# Patient Record
Sex: Female | Born: 1975 | Race: White | Hispanic: No | Marital: Married | Smoking: Former smoker
Health system: Southern US, Community
[De-identification: ages and names within clinical notes are randomized; demographics above are authoritative.]

## PROBLEM LIST (undated history)

## (undated) DIAGNOSIS — K219 Gastro-esophageal reflux disease without esophagitis: Secondary | ICD-10-CM

## (undated) DIAGNOSIS — G4730 Sleep apnea, unspecified: Secondary | ICD-10-CM

## (undated) DIAGNOSIS — J45909 Unspecified asthma, uncomplicated: Secondary | ICD-10-CM

## (undated) DIAGNOSIS — E119 Type 2 diabetes mellitus without complications: Secondary | ICD-10-CM

## (undated) HISTORY — PX: TUBAL LIGATION: SHX77

---

## 2010-05-01 ENCOUNTER — Emergency Department (HOSPITAL_COMMUNITY): Admission: EM | Admit: 2010-05-01 | Discharge: 2010-05-02 | Payer: Self-pay | Admitting: Emergency Medicine

## 2011-03-13 LAB — POCT I-STAT, CHEM 8
BUN: 10 mg/dL (ref 6–23)
Chloride: 102 mEq/L (ref 96–112)
Potassium: 3.5 mEq/L (ref 3.5–5.1)
Sodium: 137 mEq/L (ref 135–145)
TCO2: 26 mmol/L (ref 0–100)

## 2011-03-13 LAB — URINE MICROSCOPIC-ADD ON

## 2011-03-13 LAB — URINALYSIS, ROUTINE W REFLEX MICROSCOPIC
Glucose, UA: NEGATIVE mg/dL
Protein, ur: NEGATIVE mg/dL
Specific Gravity, Urine: >1.03 — ABNORMAL HIGH (ref 1.005–1.030)

## 2011-12-21 ENCOUNTER — Encounter: Payer: Self-pay | Admitting: *Deleted

## 2011-12-21 ENCOUNTER — Emergency Department (HOSPITAL_COMMUNITY)
Admission: EM | Admit: 2011-12-21 | Discharge: 2011-12-22 | Disposition: A | Discharge status: Home / Self Care | Payer: BC Managed Care – PPO | Attending: Emergency Medicine | Admitting: Emergency Medicine

## 2011-12-21 ENCOUNTER — Emergency Department (HOSPITAL_COMMUNITY): Payer: BC Managed Care – PPO

## 2011-12-21 DIAGNOSIS — R109 Unspecified abdominal pain: Secondary | ICD-10-CM | POA: Insufficient documentation

## 2011-12-21 DIAGNOSIS — E860 Dehydration: Secondary | ICD-10-CM

## 2011-12-21 DIAGNOSIS — Z87891 Personal history of nicotine dependence: Secondary | ICD-10-CM | POA: Insufficient documentation

## 2011-12-21 DIAGNOSIS — R110 Nausea: Secondary | ICD-10-CM | POA: Insufficient documentation

## 2011-12-21 LAB — URINALYSIS, ROUTINE W REFLEX MICROSCOPIC
Bilirubin Urine: NEGATIVE
Glucose, UA: NEGATIVE mg/dL
Ketones, ur: NEGATIVE mg/dL
Nitrite: NEGATIVE
pH: 5.5 (ref 5.0–8.0)

## 2011-12-21 MED ORDER — HYDROMORPHONE HCL PF 1 MG/ML IJ SOLN
1.0000 mg | INTRAMUSCULAR | Status: DC | PRN
  Administered 2011-12-22: 1 mg via INTRAVENOUS
  Filled 2011-12-21: qty 1

## 2011-12-21 MED ORDER — KETOROLAC TROMETHAMINE 30 MG/ML IJ SOLN
30.0000 mg | Freq: Four times a day (QID) | INTRAMUSCULAR | Status: DC | PRN
  Administered 2011-12-22: 30 mg via INTRAVENOUS
  Filled 2011-12-21: qty 1

## 2011-12-21 MED ORDER — SODIUM CHLORIDE 0.9 % IV BOLUS (SEPSIS)
1000.0000 mL | Freq: Once | INTRAVENOUS | Status: AC
  Administered 2011-12-22: 1000 mL via INTRAVENOUS

## 2011-12-21 MED ORDER — ONDANSETRON HCL 4 MG/2ML IJ SOLN
4.0000 mg | Freq: Four times a day (QID) | INTRAMUSCULAR | Status: DC | PRN
  Administered 2011-12-22: 4 mg via INTRAVENOUS
  Filled 2011-12-21: qty 2

## 2011-12-21 NOTE — ED Notes (Signed)
Pt reports pain with urination that just started since arrival.  Pt reports her lower abdomen hurts with voiding.  Pt reports sharp pain in her lower back that radiates to front and is throbbing in lower abdomen.  Pt denies frequency and burning with urination.

## 2011-12-21 NOTE — ED Notes (Addendum)
Pt c/o bilateral flank pain that radiates around to front of abd area, pt c/o pain to lower abd area. Denies any uti symptoms, pt states that she had an uti about a month ago, finished antibodics, ? Kidney stones in past, does admit to nausea,

## 2011-12-21 NOTE — ED Provider Notes (Signed)
History   This chart was scribed for Vida Roller, MD by Charolett Bumpers . The patient was seen in room APA07/APA07 and the patient's care was started at 11:12pm.  CSN: [id number]  Arrival date & time 12/21/11  2047   First MD Initiated Contact with Patient 12/21/11 2302      Chief Complaint  Patient presents with  . Back Pain    (Consider location/radiation/quality/duration/timing/severity/associated sxs/prior treatment) HPI Monique Foster is a 35 y.o. female who presents to the Emergency Department complaining of intermittent, sharp, stabbing pain in her right side flank that radiates to her RLQ with an onset of about 10 hours ago. Patient states having associated nausea when pain is present. She also reports having abdominal pain for the past couple of days that has been worsening. The pain is currently tolerable, and is a 8 out of 10 at its worse. Patient reports no changes in BMs and denies dysuria, blood in stool or urine, vomiting, diarrhea, fever, and chills. She reported no h/o kidney stones, and the only abdomen surgery was a C-section.    History reviewed. No pertinent past medical history.  Past Surgical History  Procedure Date  . Cesarean section     History reviewed. No pertinent family history.  History  Substance Use Topics  . Smoking status: Former Games developer  . Smokeless tobacco: Not on file  . Alcohol Use: No    OB History    Grav Para Term Preterm Abortions TAB SAB Ect Mult Living                  Review of Systems A complete 10 system review of systems was obtained and is otherwise negative except as noted in the HPI and PMH.   Allergies  Iodine and Penicillins  Home Medications   Current Outpatient Rx  Name Route Sig Dispense Refill  . NAPROXEN 500 MG PO TABS Oral Take 1 tablet (500 mg total) by mouth 2 (two) times daily with a meal. 30 tablet 0    BP 117/64  Pulse 68  Temp(Src) 98.2 F (36.8 C) (Oral)  Resp 20  Ht 5\' 3"  (1.6 m)   Wt 220 lb (99.791 kg)  BMI 38.97 kg/m2  SpO2 98%  LMP 11/21/2011  Physical Exam  Nursing note and vitals reviewed. Constitutional: She is oriented to person, place, and time. She appears well-developed and well-nourished. No distress.  HENT:  Head: Normocephalic and atraumatic.  Mouth/Throat: Oropharynx is clear and moist. No oropharyngeal exudate.  Eyes: EOM are normal. Pupils are equal, round, and reactive to light.  Neck: Neck supple. No tracheal deviation present.  Cardiovascular: Normal rate, regular rhythm, normal heart sounds and intact distal pulses.   Pulmonary/Chest: Effort normal and breath sounds normal. No respiratory distress.  Abdominal: Soft. She exhibits no distension. There is no tenderness (non-peritoneal).       Right sided CVA tenderness   Musculoskeletal: Normal range of motion. She exhibits no edema.  Neurological: She is alert and oriented to person, place, and time. No sensory deficit.  Skin: Skin is warm and dry.  Psychiatric: She has a normal mood and affect. Her behavior is normal.    ED Course  Procedures (including critical care time)  Labs Reviewed  URINALYSIS, ROUTINE W REFLEX MICROSCOPIC - Abnormal; Notable for the following:    Specific Gravity, Urine >1.030 (*)    All other components within normal limits  BASIC METABOLIC PANEL - Abnormal; Notable for the following:  Glucose, Bld 132 (*)    All other components within normal limits  PREGNANCY, URINE  CBC  I-STAT, CHEM 8   Ct Abdomen Pelvis Wo Contrast  12/22/2011  *RADIOLOGY REPORT*  Clinical Data: Bilateral flank pain, right greater than left, with past 3 days.  Nausea and vomiting.  CT ABDOMEN AND PELVIS WITHOUT CONTRAST 12/21/2009:  Technique:  Multidetector CT imaging of the abdomen and pelvis was performed following the standard protocol without intravenous contrast.  Comparison: None.  Findings: No evidence of urinary tract calculi or obstruction on either side.  Approximate 1.3 cm  cyst arising from the lower pole of the right kidney.  Within the limits of the unenhanced technique, no other focal parenchymal abnormality involving either kidney.  Diffuse hepatic steatosis with focal sparing surrounding the gallbladder.  Riedel lobe of liver.  Within the limits the unenhanced technique, no focal hepatic parenchymal abnormality. Upper normal-sized liver measuring approximately 1.4 x 4.4 x 1.1 cm, without focal splenic parenchymal abnormality.  Gallbladder unremarkable by CT.  No biliary ductal dilation.  Normal unenhanced appearance of the pancreas and adrenal glands.  No visible aorto- iliofemoral atherosclerosis.  No significant lymphadenopathy. Retroaortic left renal vein.  Normal appearing stomach, small bowel, and colon.  Normal appendix in the right upper pelvis, containing high attenuation ingested material.  No ascites.  Uterus and ovaries unremarkable for the unenhanced technique. Urinary bladder unremarkable.  Bone window images demonstrate slight lumbar scoliosis convex right.  Visualized lung bases clear.  IMPRESSION:  1.  No evidence of urinary tract calculi or obstruction on either side. 2.  No acute abnormalities involving the abdomen or pelvis. 3.  Diffuse hepatic steatosis with focal sparing surrounding the gallbladder.  Original Report Authenticated By: Arnell Sieving, M.D.     1. Abdominal pain   2. Dehydration       MDM  Patient with right-sided abdominal pain and right flank pain. There is no significant tenderness on my exam. Suspect kidney stone on initial exam however urinalysis clear with no signs of hematuria.  Basic metabolic panel shows no acidosis and normal renal function, normal electrolytes. Complete blood count shows normal white blood cells and no anemia. Patient was given IV fluids for dehydration, pain medication. She states at this time she has no pain and on reexamination has no tenderness. I have discussed return for worsening or changing  symptoms with patient and family member, they have expressed her understanding.  I personally performed the services described in this documentation, which was scribed in my presence. The recorded information has been reviewed and considered.    Vida Roller, MD 12/22/11 [phone]

## 2011-12-22 LAB — POCT I-STAT, CHEM 8
Creatinine, Ser: 0.6 mg/dL (ref 0.50–1.10)
Glucose, Bld: 127 mg/dL — ABNORMAL HIGH (ref 70–99)
Hemoglobin: 12.2 g/dL (ref 12.0–15.0)
TCO2: 25 mmol/L (ref 0–100)

## 2011-12-22 LAB — CBC
Hemoglobin: 12.7 g/dL (ref 12.0–15.0)
MCH: 28.9 pg (ref 26.0–34.0)
MCV: 85 fL (ref 78.0–100.0)
RBC: 4.4 MIL/uL (ref 3.87–5.11)

## 2011-12-22 LAB — BASIC METABOLIC PANEL
CO2: 29 mEq/L (ref 19–32)
Calcium: 9.6 mg/dL (ref 8.4–10.5)
Creatinine, Ser: 0.6 mg/dL (ref 0.50–1.10)
Glucose, Bld: 132 mg/dL — ABNORMAL HIGH (ref 70–99)

## 2011-12-22 MED ORDER — NAPROXEN 500 MG PO TABS: 500.0000 mg | ORAL_TABLET | Freq: Two times a day (BID) | ORAL | Status: AC

## 2011-12-22 NOTE — ED Notes (Signed)
Patient is alert and oriented x 4 with respirations even and unlabored.  NAD at this time.  Discharge instructions reviewed with patient and patient verbalized understanding.  Pt transported to POV via wheelchair for pt safety, and husband to transport pt home.

## 2012-04-14 ENCOUNTER — Encounter (HOSPITAL_COMMUNITY): Payer: Self-pay | Admitting: *Deleted

## 2012-04-14 DIAGNOSIS — W57XXXA Bitten or stung by nonvenomous insect and other nonvenomous arthropods, initial encounter: Secondary | ICD-10-CM | POA: Insufficient documentation

## 2012-04-14 DIAGNOSIS — S90569A Insect bite (nonvenomous), unspecified ankle, initial encounter: Secondary | ICD-10-CM | POA: Insufficient documentation

## 2012-04-14 DIAGNOSIS — R1110 Vomiting, unspecified: Secondary | ICD-10-CM | POA: Insufficient documentation

## 2012-04-14 NOTE — ED Notes (Signed)
NVD all day,  Tick removed from rt knee today.

## 2012-04-15 ENCOUNTER — Emergency Department (HOSPITAL_COMMUNITY)
Admission: EM | Admit: 2012-04-15 | Discharge: 2012-04-15 | Disposition: A | Discharge status: Home / Self Care | Payer: BC Managed Care – PPO | Attending: Emergency Medicine | Admitting: Emergency Medicine

## 2012-04-15 DIAGNOSIS — W57XXXA Bitten or stung by nonvenomous insect and other nonvenomous arthropods, initial encounter: Secondary | ICD-10-CM

## 2012-04-15 DIAGNOSIS — R1110 Vomiting, unspecified: Secondary | ICD-10-CM

## 2012-04-15 LAB — DIFFERENTIAL
Basophils Relative: 0 % (ref 0–1)
Eosinophils Absolute: 0.1 10*3/uL (ref 0.0–0.7)
Eosinophils Relative: 1 % (ref 0–5)
Monocytes Relative: 9 % (ref 3–12)
Neutrophils Relative %: 74 % (ref 43–77)

## 2012-04-15 LAB — COMPREHENSIVE METABOLIC PANEL
Albumin: 3.5 g/dL (ref 3.5–5.2)
Alkaline Phosphatase: 56 U/L (ref 39–117)
BUN: 14 mg/dL (ref 6–23)
Calcium: 9.1 mg/dL (ref 8.4–10.5)
Potassium: 3.7 mEq/L (ref 3.5–5.1)
Total Protein: 7.3 g/dL (ref 6.0–8.3)

## 2012-04-15 LAB — CBC
HCT: 37.6 % (ref 36.0–46.0)
Hemoglobin: 12.7 g/dL (ref 12.0–15.0)
MCH: 28.5 pg (ref 26.0–34.0)
MCHC: 33.8 g/dL (ref 30.0–36.0)
MCV: 84.3 fL (ref 78.0–100.0)
Platelets: 173 10*3/uL (ref 150–400)
RBC: 4.46 MIL/uL (ref 3.87–5.11)
RDW: 12.2 % (ref 11.5–15.5)
WBC: 5.1 10*3/uL (ref 4.0–10.5)

## 2012-04-15 MED ORDER — ONDANSETRON HCL 4 MG/2ML IJ SOLN
4.0000 mg | Freq: Once | INTRAMUSCULAR | Status: AC
  Administered 2012-04-15: 4 mg via INTRAVENOUS
  Filled 2012-04-15: qty 2

## 2012-04-15 MED ORDER — HYDROMORPHONE HCL PF 1 MG/ML IJ SOLN
1.0000 mg | Freq: Once | INTRAMUSCULAR | Status: AC
  Administered 2012-04-15: 1 mg via INTRAVENOUS
  Filled 2012-04-15: qty 1

## 2012-04-15 MED ORDER — PROMETHAZINE HCL 25 MG PO TABS: 25.0000 mg | ORAL_TABLET | Freq: Four times a day (QID) | ORAL | Status: DC | PRN

## 2012-04-15 MED ORDER — HYDROCODONE-ACETAMINOPHEN 5-325 MG PO TABS: 1.0000 | ORAL_TABLET | Freq: Four times a day (QID) | ORAL | Status: AC | PRN

## 2012-04-15 MED ORDER — DOXYCYCLINE HYCLATE 100 MG PO CAPS: 100.0000 mg | ORAL_CAPSULE | Freq: Two times a day (BID) | ORAL | Status: AC

## 2012-04-15 NOTE — Discharge Instructions (Signed)
Follow up with your md this week. °

## 2012-04-15 NOTE — ED Provider Notes (Signed)
History     CSN: [id number]  Arrival date & time 04/14/12  2227   First MD Initiated Contact with Patient 04/15/12 0232      Chief Complaint  Patient presents with  . Emesis    (Consider location/radiation/quality/duration/timing/severity/associated sxs/prior treatment) Patient is a 36 y.o. female presenting with vomiting. The history is provided by the patient (pt with vomiting and tick bite). No language interpreter was used.  Emesis  This is a new problem. The current episode started 12 to 24 hours ago. The problem occurs 5 to 10 times per day. The problem has not changed since onset.The emesis has an appearance of stomach contents. There has been no fever. Pertinent negatives include no abdominal pain, no chills, no cough, no diarrhea and no headaches. Risk factors: none.    History reviewed. No pertinent past medical history.  Past Surgical History  Procedure Date  . Cesarean section     History reviewed. No pertinent family history.  History  Substance Use Topics  . Smoking status: Former Games developer  . Smokeless tobacco: Not on file  . Alcohol Use: No    OB History    Grav Para Term Preterm Abortions TAB SAB Ect Mult Living                  Review of Systems  Constitutional: Negative for chills and fatigue.  HENT: Negative for congestion, sinus pressure and ear discharge.   Eyes: Negative for discharge.  Respiratory: Negative for cough.   Cardiovascular: Negative for chest pain.  Gastrointestinal: Positive for vomiting. Negative for abdominal pain and diarrhea.  Genitourinary: Negative for frequency and hematuria.  Musculoskeletal: Negative for back pain.  Skin: Negative for rash.  Neurological: Negative for seizures and headaches.  Hematological: Negative.   Psychiatric/Behavioral: Negative for hallucinations.    Allergies  Iodine and Penicillins  Home Medications   Current Outpatient Rx  Name Route Sig Dispense Refill  . DOXYCYCLINE HYCLATE 100 MG  PO CAPS Oral Take 1 capsule (100 mg total) by mouth 2 (two) times daily. 20 capsule 0  . HYDROCODONE-ACETAMINOPHEN 5-325 MG PO TABS Oral Take 1 tablet by mouth every 6 (six) hours as needed for pain. 10 tablet 0  . NAPROXEN 500 MG PO TABS Oral Take 1 tablet (500 mg total) by mouth 2 (two) times daily with a meal. 30 tablet 0  . PROMETHAZINE HCL 25 MG PO TABS Oral Take 1 tablet (25 mg total) by mouth every 6 (six) hours as needed for nausea. 15 tablet 0    BP 110/63  Pulse 106  Temp(Src) 98.6 F (37 C) (Oral)  Resp 20  Ht 5\' 3"  (1.6 m)  Wt 215 lb (97.523 kg)  BMI 38.09 kg/m2  SpO2 99%  LMP 04/10/2012  Physical Exam  Constitutional: She is oriented to person, place, and time. She appears well-developed.  HENT:  Head: Normocephalic and atraumatic.  Eyes: Conjunctivae and EOM are normal. No scleral icterus.  Neck: Neck supple. No thyromegaly present.  Cardiovascular: Normal rate and regular rhythm.  Exam reveals no gallop and no friction rub.   No murmur heard. Pulmonary/Chest: No stridor. She has no wheezes. She has no rales. She exhibits no tenderness.  Abdominal: She exhibits no distension. There is no tenderness. There is no rebound.  Musculoskeletal: Normal range of motion. She exhibits no edema.  Lymphadenopathy:    She has no cervical adenopathy.  Neurological: She is oriented to person, place, and time. Coordination normal.  Skin: No rash  noted. No erythema.  Psychiatric: She has a normal mood and affect. Her behavior is normal.    ED Course  Procedures (including critical care time)  Labs Reviewed  COMPREHENSIVE METABOLIC PANEL - Abnormal; Notable for the following:    Glucose, Bld 141 (*)    All other components within normal limits  CBC  DIFFERENTIAL   No results found.   1. Vomiting   2. Tick bite       MDM  Viral syndrome. Will cover for rmsf since recent tick bite        Benny Lennert, MD 04/15/12 [phone]

## 2014-03-31 DIAGNOSIS — E139 Other specified diabetes mellitus without complications: Secondary | ICD-10-CM | POA: Insufficient documentation

## 2014-05-22 ENCOUNTER — Encounter (HOSPITAL_COMMUNITY): Payer: Self-pay | Admitting: Emergency Medicine

## 2014-05-22 ENCOUNTER — Emergency Department (HOSPITAL_COMMUNITY)
Admission: EM | Admit: 2014-05-22 | Discharge: 2014-05-22 | Disposition: A | Discharge status: Home / Self Care | Payer: BC Managed Care – PPO | Attending: Emergency Medicine | Admitting: Emergency Medicine

## 2014-05-22 DIAGNOSIS — T368X5A Adverse effect of other systemic antibiotics, initial encounter: Secondary | ICD-10-CM | POA: Insufficient documentation

## 2014-05-22 DIAGNOSIS — R221 Localized swelling, mass and lump, neck: Secondary | ICD-10-CM | POA: Diagnosis present

## 2014-05-22 DIAGNOSIS — R220 Localized swelling, mass and lump, head: Secondary | ICD-10-CM | POA: Diagnosis not present

## 2014-05-22 DIAGNOSIS — J320 Chronic maxillary sinusitis: Secondary | ICD-10-CM | POA: Diagnosis not present

## 2014-05-22 DIAGNOSIS — Z87891 Personal history of nicotine dependence: Secondary | ICD-10-CM | POA: Diagnosis not present

## 2014-05-22 DIAGNOSIS — Z880 Allergy status to penicillin: Secondary | ICD-10-CM | POA: Insufficient documentation

## 2014-05-22 DIAGNOSIS — IMO0002 Reserved for concepts with insufficient information to code with codable children: Secondary | ICD-10-CM | POA: Insufficient documentation

## 2014-05-22 DIAGNOSIS — J45909 Unspecified asthma, uncomplicated: Secondary | ICD-10-CM | POA: Insufficient documentation

## 2014-05-22 DIAGNOSIS — Z91040 Latex allergy status: Secondary | ICD-10-CM | POA: Diagnosis not present

## 2014-05-22 DIAGNOSIS — Z792 Long term (current) use of antibiotics: Secondary | ICD-10-CM | POA: Diagnosis not present

## 2014-05-22 DIAGNOSIS — K219 Gastro-esophageal reflux disease without esophagitis: Secondary | ICD-10-CM | POA: Diagnosis not present

## 2014-05-22 DIAGNOSIS — T7840XA Allergy, unspecified, initial encounter: Secondary | ICD-10-CM

## 2014-05-22 HISTORY — DX: Unspecified asthma, uncomplicated: J45.909

## 2014-05-22 HISTORY — DX: Gastro-esophageal reflux disease without esophagitis: K21.9

## 2014-05-22 MED ORDER — DIPHENHYDRAMINE HCL 50 MG/ML IJ SOLN
25.0000 mg | Freq: Once | INTRAMUSCULAR | Status: AC
  Administered 2014-05-22: 25 mg via INTRAVENOUS
  Filled 2014-05-22: qty 1

## 2014-05-22 MED ORDER — METHYLPREDNISOLONE SODIUM SUCC 125 MG IJ SOLR
125.0000 mg | Freq: Once | INTRAMUSCULAR | Status: AC
  Administered 2014-05-22: 125 mg via INTRAVENOUS

## 2014-05-22 MED ORDER — SULFAMETHOXAZOLE-TMP DS 800-160 MG PO TABS
1.0000 | ORAL_TABLET | Freq: Once | ORAL | Status: AC
  Administered 2014-05-22: 1 via ORAL
  Filled 2014-05-22: qty 1

## 2014-05-22 MED ORDER — DIPHENHYDRAMINE HCL 50 MG/ML IJ SOLN
INTRAMUSCULAR | Status: AC
  Filled 2014-05-22: qty 1

## 2014-05-22 MED ORDER — FAMOTIDINE IN NACL 20-0.9 MG/50ML-% IV SOLN
20.0000 mg | Freq: Once | INTRAVENOUS | Status: AC
  Administered 2014-05-22: 20 mg via INTRAVENOUS
  Filled 2014-05-22: qty 50

## 2014-05-22 MED ORDER — METHYLPREDNISOLONE SODIUM SUCC 125 MG IJ SOLR
62.5000 mg | Freq: Once | INTRAMUSCULAR | Status: AC
  Administered 2014-05-22: 62.5 mg via INTRAVENOUS
  Filled 2014-05-22: qty 2

## 2014-05-22 MED ORDER — PREDNISONE 20 MG PO TABS: ORAL_TABLET | ORAL | Status: DC

## 2014-05-22 MED ORDER — FAMOTIDINE 20 MG PO TABS: 20.0000 mg | ORAL_TABLET | Freq: Two times a day (BID) | ORAL | Status: DC

## 2014-05-22 MED ORDER — SULFAMETHOXAZOLE-TRIMETHOPRIM 800-160 MG PO TABS: 1.0000 | ORAL_TABLET | Freq: Two times a day (BID) | ORAL | Status: DC

## 2014-05-22 MED ORDER — FAMOTIDINE IN NACL 20-0.9 MG/50ML-% IV SOLN
INTRAVENOUS | Status: AC
  Administered 2014-05-22: 20 mg via INTRAVENOUS
  Filled 2014-05-22: qty 50

## 2014-05-22 MED ORDER — METHYLPREDNISOLONE SODIUM SUCC 125 MG IJ SOLR
INTRAMUSCULAR | Status: AC
  Filled 2014-05-22: qty 2

## 2014-05-22 MED ORDER — DIPHENHYDRAMINE HCL 50 MG/ML IJ SOLN
25.0000 mg | Freq: Once | INTRAMUSCULAR | Status: AC
  Administered 2014-05-22: 25 mg via INTRAVENOUS

## 2014-05-22 MED ORDER — FAMOTIDINE IN NACL 20-0.9 MG/50ML-% IV SOLN
20.0000 mg | Freq: Once | INTRAVENOUS | Status: AC
  Administered 2014-05-22: 20 mg via INTRAVENOUS

## 2014-05-22 NOTE — ED Notes (Signed)
Started being treated for a sinus infection yesterday.  Facial swelling.

## 2014-05-22 NOTE — ED Provider Notes (Signed)
CSN: [id number]     Arrival date & time 05/22/14  0925 History   This chart was scribed for Monique HutchingBrian Carlesha Seiple, MD by Bronson CurbJacqueline Foster, ED Scribe. This patient was seen in room APA14/APA14 and the patient's care was started at 9:34 AM.    Chief Complaint  Patient presents with  . Allergic Reaction      The history is provided by the patient. No language interpreter was used.    HPI Comments: Monique Foster is a 38 y.o. female who presents to the Emergency Department for allergic reaction that occurred yesterday. Patient states she was seen by a PA at [facility] in [place] for a sinus infection. She was given flonase and levaquin as treatment. She reports upon taking the medication her face began to swell. There is associated right sided facial pain, swelling, and redness. Patient also reports that on the 1st of May, she was given a Z-pack and steroid shot for her asthma difficulties. She denies SOB, rashes, or swelling in other areas. Severity is moderate.   Past Medical History  Diagnosis Date  . GERD (gastroesophageal reflux disease)   . Asthma    Past Surgical History  Procedure Laterality Date  . Cesarean section     History reviewed. No pertinent family history. History  Substance Use Topics  . Smoking status: Former Games developermoker  . Smokeless tobacco: Not on file  . Alcohol Use: No   OB History   Grav Para Term Preterm Abortions TAB SAB Ect Mult Living                 Review of Systems A complete 10 system review of systems was obtained and all systems are negative except as noted in the HPI and PMH.     Allergies  Iodine; Shellfish allergy; Levaquin; Latex; and Penicillins  Home Medications   Prior to Admission medications   Medication Sig Start Date End Date Taking? Authorizing Provider  Aspirin-Acetaminophen (GOODYS BODY PAIN PO) Take 1 packet by mouth every 6 (six) hours as needed (Pain).   Yes Historical Provider, MD  beclomethasone (QVAR) 40 MCG/ACT inhaler  Inhale 1 puff into the lungs 2 (two) times daily as needed (Shortness of Breath).   Yes Historical Provider, MD  cetirizine (ZYRTEC) 10 MG tablet Take 10 mg by mouth daily.   Yes Historical Provider, MD  CINNAMON PO Take 2 capsules by mouth daily.   Yes Historical Provider, MD  esomeprazole (NEXIUM) 20 MG capsule Take 20 mg by mouth daily at 12 noon.   Yes Historical Provider, MD  fluticasone (FLONASE) 50 MCG/ACT nasal spray Place 1 spray into both nostrils daily as needed for allergies or rhinitis.   Yes Historical Provider, MD  levofloxacin (LEVAQUIN) 750 MG tablet Take 750 mg by mouth daily. 05/21/14  Yes Historical Provider, MD  pseudoephedrine (SUDAFED) 30 MG tablet Take 30 mg by mouth every 4 (four) hours as needed for congestion.   Yes Historical Provider, MD  famotidine (PEPCID) 20 MG tablet Take 1 tablet (20 mg total) by mouth 2 (two) times daily. 05/22/14   Monique HutchingBrian Jackee Glasner, MD  predniSONE (DELTASONE) 20 MG tablet 3 tabs po day one, then 2 po daily x 4 days 05/22/14   Monique HutchingBrian Royetta Probus, MD  sulfamethoxazole-trimethoprim [facility](SEPTRA DS) 800-160 MG per tablet Take 1 tablet by mouth every 12 (twelve) hours. 05/22/14   Monique HutchingBrian Aldora Perman, MD   Triage Vitals: BP 150/100  Pulse 83  Temp(Src) 99.7 F (37.6 C) (Oral)  Resp  18  Ht 5\' 4"  (1.626 m)  Wt 219 lb (99.338 kg)  BMI 37.57 kg/m2  SpO2 99%  LMP 05/16/2014  Physical Exam  Nursing note and vitals reviewed. Constitutional: She is oriented to person, place, and time. She appears well-developed and well-nourished.  HENT:  Head: Normocephalic and atraumatic.  Eyes: Conjunctivae and EOM are normal. Pupils are equal, round, and reactive to light.  Neck: Normal range of motion. Neck supple.  Cardiovascular: Normal rate, regular rhythm and normal heart sounds.   Pulmonary/Chest: Effort normal and breath sounds normal.  Abdominal: Soft. Bowel sounds are normal.  Musculoskeletal: Normal range of motion.  Neurological: She is alert and oriented to person, place, and  time.  Skin: Skin is warm and dry. There is erythema.  Puffy and erythematous. No other rashes.   Psychiatric: She has a normal mood and affect. Her behavior is normal.    ED Course  Procedures (including critical care time)  DIAGNOSTIC STUDIES: Oxygen Saturation is 99% on room air, normal by my interpretation.    COORDINATION OF CARE: At [phone] Discussed treatment plan with patient which includes solumedrol, pepcid and Benadryl. Patient agrees.   Labs Review Labs Reviewed - No data to display  Imaging Review No results found.   EKG Interpretation None      MDM   Final diagnoses:  Allergic reaction  Maxillary sinusitis    Patient looks and feels much better after IV Solu-Medrol, Benadryl, Pepcid.     We'll discontinue Levaquin. Rx for Septra DS, prednisone, Pepcid.  I personally performed the services described in this documentation, which was scribed in my presence. The recorded information has been reviewed and is accurate.      Monique Hutching, MD 05/22/14 1356

## 2014-05-22 NOTE — Discharge Instructions (Signed)
Do not take Levaquin. New prescription for Bactrim or Septra DS.   Also prescription for prednisone and Pepcid. Return if worse

## 2014-11-23 ENCOUNTER — Emergency Department (HOSPITAL_COMMUNITY): Payer: 59

## 2014-11-23 ENCOUNTER — Encounter (HOSPITAL_COMMUNITY): Payer: Self-pay | Admitting: Emergency Medicine

## 2014-11-23 ENCOUNTER — Emergency Department (HOSPITAL_COMMUNITY)
Admission: EM | Admit: 2014-11-23 | Discharge: 2014-11-23 | Disposition: A | Discharge status: Home / Self Care | Payer: 59 | Attending: Emergency Medicine | Admitting: Emergency Medicine

## 2014-11-23 DIAGNOSIS — K219 Gastro-esophageal reflux disease without esophagitis: Secondary | ICD-10-CM | POA: Insufficient documentation

## 2014-11-23 DIAGNOSIS — Z880 Allergy status to penicillin: Secondary | ICD-10-CM | POA: Diagnosis not present

## 2014-11-23 DIAGNOSIS — S022XXA Fracture of nasal bones, initial encounter for closed fracture: Secondary | ICD-10-CM | POA: Diagnosis not present

## 2014-11-23 DIAGNOSIS — Y92410 Unspecified street and highway as the place of occurrence of the external cause: Secondary | ICD-10-CM | POA: Insufficient documentation

## 2014-11-23 DIAGNOSIS — Z79899 Other long term (current) drug therapy: Secondary | ICD-10-CM | POA: Diagnosis not present

## 2014-11-23 DIAGNOSIS — I10 Essential (primary) hypertension: Secondary | ICD-10-CM | POA: Insufficient documentation

## 2014-11-23 DIAGNOSIS — Y9389 Activity, other specified: Secondary | ICD-10-CM | POA: Diagnosis not present

## 2014-11-23 DIAGNOSIS — S0990XA Unspecified injury of head, initial encounter: Secondary | ICD-10-CM | POA: Insufficient documentation

## 2014-11-23 DIAGNOSIS — Y998 Other external cause status: Secondary | ICD-10-CM | POA: Diagnosis not present

## 2014-11-23 DIAGNOSIS — E119 Type 2 diabetes mellitus without complications: Secondary | ICD-10-CM | POA: Insufficient documentation

## 2014-11-23 DIAGNOSIS — Z91040 Latex allergy status: Secondary | ICD-10-CM | POA: Insufficient documentation

## 2014-11-23 DIAGNOSIS — S0993XA Unspecified injury of face, initial encounter: Secondary | ICD-10-CM | POA: Diagnosis present

## 2014-11-23 DIAGNOSIS — T148XXA Other injury of unspecified body region, initial encounter: Secondary | ICD-10-CM

## 2014-11-23 DIAGNOSIS — Z792 Long term (current) use of antibiotics: Secondary | ICD-10-CM | POA: Insufficient documentation

## 2014-11-23 HISTORY — DX: Type 2 diabetes mellitus without complications: E11.9

## 2014-11-23 MED ORDER — HYDROCODONE-ACETAMINOPHEN 5-325 MG PO TABS: 1.0000 | ORAL_TABLET | ORAL | Status: DC | PRN

## 2014-11-23 NOTE — Discharge Instructions (Signed)
Facial Fracture A facial fracture is a break in one of the bones of your face. HOME CARE INSTRUCTIONS   Protect the injured part of your face until it is healed.  Do not participate in activities which give chance for re-injury until your doctor approves.  Gently wash and dry your face.  Wear head and facial protection while riding a bicycle, motorcycle, or snowmobile. SEEK MEDICAL CARE IF:   An oral temperature above 102 F (38.9 C) develops.  You have severe headaches or notice changes in your vision.  You have new numbness or tingling in your face.  You develop nausea (feeling sick to your stomach), vomiting or a stiff neck. SEEK IMMEDIATE MEDICAL CARE IF:   You develop difficulty seeing or experience double vision.  You become dizzy, lightheaded, or faint.  You develop trouble speaking, breathing, or swallowing.  You have a watery discharge from your nose or ear. MAKE SURE YOU:   Understand these instructions.  Will watch your condition.  Will get help right away if you are not doing well or get worse. Document Released: 12/10/2005 Document Revised: 03/03/2012 Document Reviewed: 07/29/2008 [facility] Patient Information 2015 [place], [place]. This information is not intended to replace advice given to you by your health care provider. Make sure you discuss any questions you have with your health care provider.  Abrasion An abrasion is a cut or scrape of the skin. Abrasions do not extend through all layers of the skin and most heal within 10 days. It is important to care for your abrasion properly to prevent infection. CAUSES  Most abrasions are caused by falling on, or gliding across, the ground or other surface. When your skin rubs on something, the outer and inner layer of skin rubs off, causing an abrasion. DIAGNOSIS  Your caregiver will be able to diagnose an abrasion during a physical exam.  TREATMENT  Your treatment depends on how large and deep the abrasion is.  Generally, your abrasion will be cleaned with water and a mild soap to remove any dirt or debris. An antibiotic ointment may be put over the abrasion to prevent an infection. A bandage (dressing) may be wrapped around the abrasion to keep it from getting dirty.  You may need a tetanus shot if:  You cannot remember when you had your last tetanus shot.  You have never had a tetanus shot.  The injury broke your skin. If you get a tetanus shot, your arm may swell, get red, and feel warm to the touch. This is common and not a problem. If you need a tetanus shot and you choose not to have one, there is a rare chance of getting tetanus. Sickness from tetanus can be serious.  HOME CARE INSTRUCTIONS   If a dressing was applied, change it at least once a day or as directed by your caregiver. If the bandage sticks, soak it off with warm water.   Wash the area with water and a mild soap to remove all the ointment 2 times a day. Rinse off the soap and pat the area dry with a clean towel.   Reapply any ointment as directed by your caregiver. This will help prevent infection and keep the bandage from sticking. Use gauze over the wound and under the dressing to help keep the bandage from sticking.   Change your dressing right away if it becomes wet or dirty.   Only take over-the-counter or prescription medicines for pain, discomfort, or fever as directed by your  caregiver.   Follow up with your caregiver within 24-48 hours for a wound check, or as directed. If you were not given a wound-check appointment, look closely at your abrasion for redness, swelling, or pus. These are signs of infection. SEEK IMMEDIATE MEDICAL CARE IF:   You have increasing pain in the wound.   You have redness, swelling, or tenderness around the wound.   You have pus coming from the wound.   You have a fever or persistent symptoms for more than 2-3 days.  You have a fever and your symptoms suddenly get worse.  You  have a bad smell coming from the wound or dressing.  MAKE SURE YOU:   Understand these instructions.  Will watch your condition.  Will get help right away if you are not doing well or get worse. Document Released: 09/19/2005 Document Revised: 11/26/2012 Document Reviewed: 11/13/2011 [facility] Patient Information 2015 [place] [employer] GutierrezExitCare, [place]LL[place]. This information is not intended to replace advice given to you by your health care provider. Make sure you discuss any questions you have with your health care provider.
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
## 2015-02-11 ENCOUNTER — Encounter (HOSPITAL_COMMUNITY): Payer: Self-pay | Admitting: *Deleted

## 2015-02-11 ENCOUNTER — Emergency Department (HOSPITAL_COMMUNITY)
Admission: EM | Admit: 2015-02-11 | Discharge: 2015-02-11 | Disposition: A | Discharge status: Home / Self Care | Payer: BLUE CROSS/BLUE SHIELD | Attending: Emergency Medicine | Admitting: Emergency Medicine

## 2015-02-11 ENCOUNTER — Emergency Department (HOSPITAL_COMMUNITY): Payer: BLUE CROSS/BLUE SHIELD

## 2015-02-11 DIAGNOSIS — Y9389 Activity, other specified: Secondary | ICD-10-CM | POA: Insufficient documentation

## 2015-02-11 DIAGNOSIS — Z79899 Other long term (current) drug therapy: Secondary | ICD-10-CM | POA: Insufficient documentation

## 2015-02-11 DIAGNOSIS — Z880 Allergy status to penicillin: Secondary | ICD-10-CM | POA: Diagnosis not present

## 2015-02-11 DIAGNOSIS — Z91040 Latex allergy status: Secondary | ICD-10-CM | POA: Insufficient documentation

## 2015-02-11 DIAGNOSIS — S40021A Contusion of right upper arm, initial encounter: Secondary | ICD-10-CM | POA: Diagnosis not present

## 2015-02-11 DIAGNOSIS — S90122A Contusion of left lesser toe(s) without damage to nail, initial encounter: Secondary | ICD-10-CM | POA: Diagnosis not present

## 2015-02-11 DIAGNOSIS — S300XXA Contusion of lower back and pelvis, initial encounter: Secondary | ICD-10-CM | POA: Diagnosis not present

## 2015-02-11 DIAGNOSIS — Y998 Other external cause status: Secondary | ICD-10-CM | POA: Diagnosis not present

## 2015-02-11 DIAGNOSIS — K219 Gastro-esophageal reflux disease without esophagitis: Secondary | ICD-10-CM | POA: Diagnosis not present

## 2015-02-11 DIAGNOSIS — Z87891 Personal history of nicotine dependence: Secondary | ICD-10-CM | POA: Insufficient documentation

## 2015-02-11 DIAGNOSIS — S99922A Unspecified injury of left foot, initial encounter: Secondary | ICD-10-CM | POA: Diagnosis present

## 2015-02-11 DIAGNOSIS — Y9289 Other specified places as the place of occurrence of the external cause: Secondary | ICD-10-CM | POA: Insufficient documentation

## 2015-02-11 DIAGNOSIS — J45909 Unspecified asthma, uncomplicated: Secondary | ICD-10-CM | POA: Diagnosis not present

## 2015-02-11 DIAGNOSIS — E119 Type 2 diabetes mellitus without complications: Secondary | ICD-10-CM | POA: Insufficient documentation

## 2015-02-11 DIAGNOSIS — W108XXA Fall (on) (from) other stairs and steps, initial encounter: Secondary | ICD-10-CM | POA: Insufficient documentation

## 2015-02-11 MED ORDER — HYDROCODONE-ACETAMINOPHEN 5-325 MG PO TABS: 1.0000 | ORAL_TABLET | ORAL | Status: DC | PRN

## 2015-02-11 MED ORDER — IBUPROFEN 800 MG PO TABS: 800.0000 mg | ORAL_TABLET | Freq: Three times a day (TID) | ORAL | Status: DC

## 2015-02-11 NOTE — ED Notes (Signed)
Patient with no complaints at this time. Respirations even and unlabored. Skin warm/dry. Discharge instructions reviewed with patient at this time. Patient given opportunity to voice concerns/ask questions. Patient discharged at this time and left Emergency Department with steady gait.   

## 2015-02-11 NOTE — Discharge Instructions (Signed)
Your xrays are negative for acute problem.  Please apply ice pack to the coccyx area and the left foot. Elevate the foot at much as possible. See Dr Neita CarpSasser next week and take prescribed meds with you. Contusion A contusion is a deep bruise. Contusions happen when an injury causes bleeding under the skin. Signs of bruising include pain, puffiness (swelling), and discolored skin. The contusion may turn blue, purple, or yellow. HOME CARE   Put ice on the injured area.  Put ice in a plastic bag.  Place a towel between your skin and the bag.  Leave the ice on for 15-20 minutes, 03-04 times a day.  Only take medicine as told by your doctor.  Rest the injured area.  If possible, raise (elevate) the injured area to lessen puffiness. GET HELP RIGHT AWAY IF:   You have more bruising or puffiness.  You have pain that is getting worse.  Your puffiness or pain is not helped by medicine. MAKE SURE YOU:   Understand these instructions.  Will watch your condition.  Will get help right away if you are not doing well or get worse. Document Released: 05/28/2008 Document Revised: 03/03/2012 Document Reviewed: 10/15/2011 [facility] Patient Information 2015 [place]. This information is not intended to replace advice given to you by your health care provider. Make sure you discuss any questions you have with your health care provider.

## 2015-02-11 NOTE — ED Provider Notes (Signed)
CSN: [id number]     Arrival date & time 02/11/15  1524 History   First MD Initiated Contact with Patient 02/11/15 1607     Chief Complaint  Patient presents with  . Fall     (Consider location/radiation/quality/duration/timing/severity/associated sxs/prior Treatment) Patient is a 39 y.o. female presenting with fall. The history is provided by the patient.  Fall This is a new problem. Episode onset: Monday 2/15 and 3AM today. The problem occurs intermittently. The problem has been unchanged. Pertinent negatives include no abdominal pain, arthralgias, chest pain, coughing or neck pain. Associated symptoms comments: Right arm pain, 4th toe pain, and buttocks.. The symptoms are aggravated by standing and twisting. Treatments tried: goody powders. The treatment provided mild relief.    Past Medical History  Diagnosis Date  . GERD (gastroesophageal reflux disease)   . Asthma   . Diabetes mellitus without complication    Past Surgical History  Procedure Laterality Date  . Cesarean section    . Tubal ligation     History reviewed. No pertinent family history. History  Substance Use Topics  . Smoking status: Former Games developer  . Smokeless tobacco: Not on file  . Alcohol Use: No   OB History    No data available     Review of Systems  Constitutional: Negative for activity change.       All ROS Neg except as noted in HPI  Eyes: Negative for photophobia and discharge.  Respiratory: Negative for cough, shortness of breath and wheezing.   Cardiovascular: Negative for chest pain and palpitations.  Gastrointestinal: Negative for abdominal pain and blood in stool.  Genitourinary: Negative for dysuria, frequency and hematuria.  Musculoskeletal: Negative for back pain, arthralgias and neck pain.  Skin: Negative.   Neurological: Negative for dizziness, seizures and speech difficulty.  Psychiatric/Behavioral: Negative for hallucinations and confusion.      Allergies  Iodine; Shellfish  allergy; Levaquin; Latex; and Penicillins  Home Medications   Prior to Admission medications   Medication Sig Start Date End Date Taking? Authorizing Provider  Aspirin-Acetaminophen (GOODYS BODY PAIN PO) Take 1 packet by mouth every 6 (six) hours as needed (Pain).    Historical Provider, MD  cetirizine (ZYRTEC) 10 MG tablet Take 10 mg by mouth daily.    Historical Provider, MD  CINNAMON PO Take 2 capsules by mouth daily.    Historical Provider, MD  esomeprazole (NEXIUM 24HR) 20 MG capsule Take 20 mg by mouth daily.     Historical Provider, MD  HYDROcodone-acetaminophen (NORCO/VICODIN) 5-325 MG per tablet Take 1-2 tablets by mouth every 4 (four) hours as needed. 11/23/14   Teressa Lower, NP  metFORMIN (GLUCOPHAGE) 1000 MG tablet Take 1,000 mg by mouth daily with breakfast.    Historical Provider, MD   BP 118/72 mmHg  Pulse 86  Temp(Src) 99.1 F (37.3 C) (Oral)  Resp 20  Ht  (1.626 m)  Wt 219 lb (99.338 kg)  BMI 37.57 kg/m2  SpO2 99%  LMP 01/10/2015 (Approximate) Physical Exam  Constitutional: She is oriented to person, place, and time. She appears well-developed and well-nourished.  Non-toxic appearance.  HENT:  Head: Normocephalic and atraumatic.  Right Ear: Tympanic membrane and external ear normal.  Left Ear: Tympanic membrane and external ear normal.  Eyes: EOM and lids are normal. Pupils are equal, round, and reactive to light.  Neck: Normal range of motion. Neck supple. Carotid bruit is not present.  Cardiovascular: Normal rate, regular rhythm, normal heart sounds, intact distal pulses and normal pulses.  Pulmonary/Chest: Breath sounds normal. No respiratory distress.  Abdominal: Soft. Bowel sounds are normal. There is no tenderness. There is no guarding.  Musculoskeletal: Normal range of motion. She exhibits tenderness.  Bruising/hematoma of the distal humerus on the right. FROM of the right elbow, shoulder, wrist and fingers. Distal pulse 2+, cap refill less than 2  sec. Pain to movement and change of position at the coccyx area. Pain and bruising and swelling of the left 4th toe. DP 2+. No other injury noted.  Lymphadenopathy:       Head (right side): No submandibular adenopathy present.       Head (left side): No submandibular adenopathy present.    She has no cervical adenopathy.  Neurological: She is alert and oriented to person, place, and time. She has normal strength. No cranial nerve deficit or sensory deficit.  Skin: Skin is warm and dry.  Psychiatric: She has a normal mood and affect. Her speech is normal.  Nursing note and vitals reviewed.   ED Course  Procedures (including critical care time) Labs Review Labs Reviewed - No data to display  Imaging Review No results found.   EKG Interpretation None      MDM  Xray of the left 4th toe and the coccyx are negative for fracture. Pt has hematoma of the distal humerus area, but good ROM of the shoulder, elbow, and wrist on the right.  Suspect contusion of multiple sites.  Pt advised to apply ice, use a pillow to sit. Elevate the left leg. And use ibuprofen and norco for pain. Pt to see Dr Neita CarpSasser next week for recheck.   Final diagnoses:  Contusion of coccyx, initial encounter  Contusion, toe, left, initial encounter    **I have reviewed nursing notes, vital signs, and all appropriate lab and imaging results for this patient.Kathie Dike*    Blakeleigh Domek M Conception Doebler, PA-C 02/11/15 1751  Benny LennertJoseph L Zammit, MD 02/11/15 2209
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
## 2015-02-16 LAB — CBG MONITORING, ED: GLUCOSE-CAPILLARY: 128 mg/dL — AB (ref 70–99)
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
## 2017-04-23 DIAGNOSIS — E1165 Type 2 diabetes mellitus with hyperglycemia: Secondary | ICD-10-CM | POA: Diagnosis not present

## 2017-04-23 DIAGNOSIS — K219 Gastro-esophageal reflux disease without esophagitis: Secondary | ICD-10-CM | POA: Diagnosis not present

## 2017-06-27 DIAGNOSIS — M545 Low back pain: Secondary | ICD-10-CM | POA: Diagnosis not present

## 2017-06-27 DIAGNOSIS — M25512 Pain in left shoulder: Secondary | ICD-10-CM | POA: Diagnosis not present

## 2017-08-19 DIAGNOSIS — E781 Pure hyperglyceridemia: Secondary | ICD-10-CM | POA: Diagnosis not present

## 2017-08-19 DIAGNOSIS — E1165 Type 2 diabetes mellitus with hyperglycemia: Secondary | ICD-10-CM | POA: Diagnosis not present

## 2017-08-19 DIAGNOSIS — E782 Mixed hyperlipidemia: Secondary | ICD-10-CM | POA: Diagnosis not present

## 2017-08-21 DIAGNOSIS — E1165 Type 2 diabetes mellitus with hyperglycemia: Secondary | ICD-10-CM | POA: Diagnosis not present

## 2017-08-21 DIAGNOSIS — K219 Gastro-esophageal reflux disease without esophagitis: Secondary | ICD-10-CM | POA: Diagnosis not present

## 2017-12-11 DIAGNOSIS — E1165 Type 2 diabetes mellitus with hyperglycemia: Secondary | ICD-10-CM | POA: Diagnosis not present

## 2017-12-11 DIAGNOSIS — R739 Hyperglycemia, unspecified: Secondary | ICD-10-CM | POA: Diagnosis not present

## 2017-12-11 DIAGNOSIS — K219 Gastro-esophageal reflux disease without esophagitis: Secondary | ICD-10-CM | POA: Diagnosis not present

## 2017-12-11 DIAGNOSIS — E782 Mixed hyperlipidemia: Secondary | ICD-10-CM | POA: Diagnosis not present

## 2017-12-11 DIAGNOSIS — E781 Pure hyperglyceridemia: Secondary | ICD-10-CM | POA: Diagnosis not present

## 2017-12-13 DIAGNOSIS — E6609 Other obesity due to excess calories: Secondary | ICD-10-CM | POA: Diagnosis not present

## 2017-12-13 DIAGNOSIS — K219 Gastro-esophageal reflux disease without esophagitis: Secondary | ICD-10-CM | POA: Diagnosis not present

## 2017-12-13 DIAGNOSIS — E1165 Type 2 diabetes mellitus with hyperglycemia: Secondary | ICD-10-CM | POA: Diagnosis not present

## 2017-12-13 DIAGNOSIS — E782 Mixed hyperlipidemia: Secondary | ICD-10-CM | POA: Diagnosis not present

## 2018-01-31 DIAGNOSIS — N912 Amenorrhea, unspecified: Secondary | ICD-10-CM | POA: Diagnosis not present

## 2018-01-31 DIAGNOSIS — Z6838 Body mass index (BMI) 38.0-38.9, adult: Secondary | ICD-10-CM | POA: Diagnosis not present

## 2018-04-01 DIAGNOSIS — R739 Hyperglycemia, unspecified: Secondary | ICD-10-CM | POA: Diagnosis not present

## 2018-04-01 DIAGNOSIS — K219 Gastro-esophageal reflux disease without esophagitis: Secondary | ICD-10-CM | POA: Diagnosis not present

## 2018-04-01 DIAGNOSIS — E782 Mixed hyperlipidemia: Secondary | ICD-10-CM | POA: Diagnosis not present

## 2018-04-01 DIAGNOSIS — E1165 Type 2 diabetes mellitus with hyperglycemia: Secondary | ICD-10-CM | POA: Diagnosis not present

## 2018-04-01 DIAGNOSIS — E781 Pure hyperglyceridemia: Secondary | ICD-10-CM | POA: Diagnosis not present

## 2018-04-03 DIAGNOSIS — K219 Gastro-esophageal reflux disease without esophagitis: Secondary | ICD-10-CM | POA: Diagnosis not present

## 2018-04-03 DIAGNOSIS — E1165 Type 2 diabetes mellitus with hyperglycemia: Secondary | ICD-10-CM | POA: Diagnosis not present

## 2018-04-03 DIAGNOSIS — E782 Mixed hyperlipidemia: Secondary | ICD-10-CM | POA: Diagnosis not present

## 2018-04-03 DIAGNOSIS — G4733 Obstructive sleep apnea (adult) (pediatric): Secondary | ICD-10-CM | POA: Diagnosis not present

## 2018-06-17 DIAGNOSIS — J4531 Mild persistent asthma with (acute) exacerbation: Secondary | ICD-10-CM | POA: Diagnosis not present

## 2018-06-17 DIAGNOSIS — Z6835 Body mass index (BMI) 35.0-35.9, adult: Secondary | ICD-10-CM | POA: Diagnosis not present

## 2018-07-30 DIAGNOSIS — Z1331 Encounter for screening for depression: Secondary | ICD-10-CM | POA: Diagnosis not present

## 2018-07-30 DIAGNOSIS — Z1389 Encounter for screening for other disorder: Secondary | ICD-10-CM | POA: Diagnosis not present

## 2018-07-30 DIAGNOSIS — K219 Gastro-esophageal reflux disease without esophagitis: Secondary | ICD-10-CM | POA: Diagnosis not present

## 2018-07-30 DIAGNOSIS — E782 Mixed hyperlipidemia: Secondary | ICD-10-CM | POA: Diagnosis not present

## 2018-07-30 DIAGNOSIS — G4733 Obstructive sleep apnea (adult) (pediatric): Secondary | ICD-10-CM | POA: Diagnosis not present

## 2018-07-30 DIAGNOSIS — E1165 Type 2 diabetes mellitus with hyperglycemia: Secondary | ICD-10-CM | POA: Diagnosis not present

## 2018-09-02 DIAGNOSIS — Z6835 Body mass index (BMI) 35.0-35.9, adult: Secondary | ICD-10-CM | POA: Diagnosis not present

## 2018-09-02 DIAGNOSIS — J069 Acute upper respiratory infection, unspecified: Secondary | ICD-10-CM | POA: Diagnosis not present

## 2018-09-02 DIAGNOSIS — H66002 Acute suppurative otitis media without spontaneous rupture of ear drum, left ear: Secondary | ICD-10-CM | POA: Diagnosis not present

## 2018-11-04 DIAGNOSIS — R509 Fever, unspecified: Secondary | ICD-10-CM | POA: Diagnosis not present

## 2018-11-04 DIAGNOSIS — Z6836 Body mass index (BMI) 36.0-36.9, adult: Secondary | ICD-10-CM | POA: Diagnosis not present

## 2018-11-04 DIAGNOSIS — J069 Acute upper respiratory infection, unspecified: Secondary | ICD-10-CM | POA: Diagnosis not present

## 2018-11-25 DIAGNOSIS — E1165 Type 2 diabetes mellitus with hyperglycemia: Secondary | ICD-10-CM | POA: Diagnosis not present

## 2018-11-25 DIAGNOSIS — K219 Gastro-esophageal reflux disease without esophagitis: Secondary | ICD-10-CM | POA: Diagnosis not present

## 2018-11-25 DIAGNOSIS — G4733 Obstructive sleep apnea (adult) (pediatric): Secondary | ICD-10-CM | POA: Diagnosis not present

## 2018-11-25 DIAGNOSIS — E782 Mixed hyperlipidemia: Secondary | ICD-10-CM | POA: Diagnosis not present

## 2018-11-27 DIAGNOSIS — E782 Mixed hyperlipidemia: Secondary | ICD-10-CM | POA: Diagnosis not present

## 2018-11-27 DIAGNOSIS — E1165 Type 2 diabetes mellitus with hyperglycemia: Secondary | ICD-10-CM | POA: Diagnosis not present

## 2018-11-27 DIAGNOSIS — K219 Gastro-esophageal reflux disease without esophagitis: Secondary | ICD-10-CM | POA: Diagnosis not present

## 2018-11-27 DIAGNOSIS — G4733 Obstructive sleep apnea (adult) (pediatric): Secondary | ICD-10-CM | POA: Diagnosis not present

## 2019-03-26 DIAGNOSIS — G4733 Obstructive sleep apnea (adult) (pediatric): Secondary | ICD-10-CM | POA: Diagnosis not present

## 2019-03-26 DIAGNOSIS — K219 Gastro-esophageal reflux disease without esophagitis: Secondary | ICD-10-CM | POA: Diagnosis not present

## 2019-03-26 DIAGNOSIS — E782 Mixed hyperlipidemia: Secondary | ICD-10-CM | POA: Diagnosis not present

## 2019-03-26 DIAGNOSIS — E1165 Type 2 diabetes mellitus with hyperglycemia: Secondary | ICD-10-CM | POA: Diagnosis not present

## 2019-04-01 DIAGNOSIS — E1165 Type 2 diabetes mellitus with hyperglycemia: Secondary | ICD-10-CM | POA: Diagnosis not present

## 2019-04-01 DIAGNOSIS — G4733 Obstructive sleep apnea (adult) (pediatric): Secondary | ICD-10-CM | POA: Diagnosis not present

## 2019-04-01 DIAGNOSIS — E782 Mixed hyperlipidemia: Secondary | ICD-10-CM | POA: Diagnosis not present

## 2019-04-01 DIAGNOSIS — K219 Gastro-esophageal reflux disease without esophagitis: Secondary | ICD-10-CM | POA: Diagnosis not present

## 2019-07-22 DIAGNOSIS — E782 Mixed hyperlipidemia: Secondary | ICD-10-CM | POA: Diagnosis not present

## 2019-07-22 DIAGNOSIS — E1165 Type 2 diabetes mellitus with hyperglycemia: Secondary | ICD-10-CM | POA: Diagnosis not present

## 2019-07-22 DIAGNOSIS — G4733 Obstructive sleep apnea (adult) (pediatric): Secondary | ICD-10-CM | POA: Diagnosis not present

## 2019-07-22 DIAGNOSIS — E781 Pure hyperglyceridemia: Secondary | ICD-10-CM | POA: Diagnosis not present

## 2019-07-28 DIAGNOSIS — G4733 Obstructive sleep apnea (adult) (pediatric): Secondary | ICD-10-CM | POA: Diagnosis not present

## 2019-07-28 DIAGNOSIS — E1165 Type 2 diabetes mellitus with hyperglycemia: Secondary | ICD-10-CM | POA: Diagnosis not present

## 2019-07-28 DIAGNOSIS — K219 Gastro-esophageal reflux disease without esophagitis: Secondary | ICD-10-CM | POA: Diagnosis not present

## 2019-07-28 DIAGNOSIS — E782 Mixed hyperlipidemia: Secondary | ICD-10-CM | POA: Diagnosis not present

## 2019-12-14 DIAGNOSIS — E782 Mixed hyperlipidemia: Secondary | ICD-10-CM | POA: Diagnosis not present

## 2019-12-14 DIAGNOSIS — K219 Gastro-esophageal reflux disease without esophagitis: Secondary | ICD-10-CM | POA: Diagnosis not present

## 2019-12-14 DIAGNOSIS — E781 Pure hyperglyceridemia: Secondary | ICD-10-CM | POA: Diagnosis not present

## 2019-12-14 DIAGNOSIS — E1165 Type 2 diabetes mellitus with hyperglycemia: Secondary | ICD-10-CM | POA: Diagnosis not present

## 2019-12-16 DIAGNOSIS — R945 Abnormal results of liver function studies: Secondary | ICD-10-CM | POA: Diagnosis not present

## 2019-12-16 DIAGNOSIS — E1165 Type 2 diabetes mellitus with hyperglycemia: Secondary | ICD-10-CM | POA: Diagnosis not present

## 2019-12-16 DIAGNOSIS — Z1389 Encounter for screening for other disorder: Secondary | ICD-10-CM | POA: Diagnosis not present

## 2019-12-16 DIAGNOSIS — K219 Gastro-esophageal reflux disease without esophagitis: Secondary | ICD-10-CM | POA: Diagnosis not present

## 2019-12-16 DIAGNOSIS — E782 Mixed hyperlipidemia: Secondary | ICD-10-CM | POA: Diagnosis not present

## 2019-12-16 DIAGNOSIS — Z1331 Encounter for screening for depression: Secondary | ICD-10-CM | POA: Diagnosis not present

## 2020-03-16 DIAGNOSIS — Z1151 Encounter for screening for human papillomavirus (HPV): Secondary | ICD-10-CM | POA: Diagnosis not present

## 2020-03-16 DIAGNOSIS — Z01419 Encounter for gynecological examination (general) (routine) without abnormal findings: Secondary | ICD-10-CM | POA: Diagnosis not present

## 2020-03-16 DIAGNOSIS — Z1231 Encounter for screening mammogram for malignant neoplasm of breast: Secondary | ICD-10-CM | POA: Diagnosis not present

## 2020-03-16 DIAGNOSIS — Z780 Asymptomatic menopausal state: Secondary | ICD-10-CM | POA: Diagnosis not present

## 2020-03-24 DIAGNOSIS — Z6839 Body mass index (BMI) 39.0-39.9, adult: Secondary | ICD-10-CM | POA: Diagnosis not present

## 2020-03-24 DIAGNOSIS — R87610 Atypical squamous cells of undetermined significance on cytologic smear of cervix (ASC-US): Secondary | ICD-10-CM | POA: Diagnosis not present

## 2020-03-24 DIAGNOSIS — R87810 Cervical high risk human papillomavirus (HPV) DNA test positive: Secondary | ICD-10-CM | POA: Diagnosis not present

## 2020-04-07 DIAGNOSIS — R87619 Unspecified abnormal cytological findings in specimens from cervix uteri: Secondary | ICD-10-CM | POA: Diagnosis not present

## 2020-04-07 DIAGNOSIS — N870 Mild cervical dysplasia: Secondary | ICD-10-CM | POA: Diagnosis not present

## 2020-04-07 DIAGNOSIS — Z6839 Body mass index (BMI) 39.0-39.9, adult: Secondary | ICD-10-CM | POA: Diagnosis not present

## 2020-04-07 DIAGNOSIS — R87610 Atypical squamous cells of undetermined significance on cytologic smear of cervix (ASC-US): Secondary | ICD-10-CM | POA: Diagnosis not present

## 2020-04-07 DIAGNOSIS — R87810 Cervical high risk human papillomavirus (HPV) DNA test positive: Secondary | ICD-10-CM | POA: Diagnosis not present

## 2020-04-12 DIAGNOSIS — E782 Mixed hyperlipidemia: Secondary | ICD-10-CM | POA: Diagnosis not present

## 2020-04-12 DIAGNOSIS — E1165 Type 2 diabetes mellitus with hyperglycemia: Secondary | ICD-10-CM | POA: Diagnosis not present

## 2020-04-12 DIAGNOSIS — R739 Hyperglycemia, unspecified: Secondary | ICD-10-CM | POA: Diagnosis not present

## 2020-04-12 DIAGNOSIS — K219 Gastro-esophageal reflux disease without esophagitis: Secondary | ICD-10-CM | POA: Diagnosis not present

## 2020-04-14 DIAGNOSIS — E782 Mixed hyperlipidemia: Secondary | ICD-10-CM | POA: Diagnosis not present

## 2020-04-14 DIAGNOSIS — K219 Gastro-esophageal reflux disease without esophagitis: Secondary | ICD-10-CM | POA: Diagnosis not present

## 2020-04-14 DIAGNOSIS — E1165 Type 2 diabetes mellitus with hyperglycemia: Secondary | ICD-10-CM | POA: Diagnosis not present

## 2020-04-14 DIAGNOSIS — G4733 Obstructive sleep apnea (adult) (pediatric): Secondary | ICD-10-CM | POA: Diagnosis not present

## 2020-04-15 DIAGNOSIS — Z6839 Body mass index (BMI) 39.0-39.9, adult: Secondary | ICD-10-CM | POA: Diagnosis not present

## 2020-04-15 DIAGNOSIS — R87619 Unspecified abnormal cytological findings in specimens from cervix uteri: Secondary | ICD-10-CM | POA: Diagnosis not present

## 2020-04-27 DIAGNOSIS — Z91040 Latex allergy status: Secondary | ICD-10-CM | POA: Diagnosis not present

## 2020-04-27 DIAGNOSIS — Z7984 Long term (current) use of oral hypoglycemic drugs: Secondary | ICD-10-CM | POA: Diagnosis not present

## 2020-04-27 DIAGNOSIS — E669 Obesity, unspecified: Secondary | ICD-10-CM | POA: Diagnosis not present

## 2020-04-27 DIAGNOSIS — K219 Gastro-esophageal reflux disease without esophagitis: Secondary | ICD-10-CM | POA: Diagnosis not present

## 2020-04-27 DIAGNOSIS — Z87891 Personal history of nicotine dependence: Secondary | ICD-10-CM | POA: Diagnosis not present

## 2020-04-27 DIAGNOSIS — Z01818 Encounter for other preprocedural examination: Secondary | ICD-10-CM | POA: Diagnosis not present

## 2020-04-27 DIAGNOSIS — N870 Mild cervical dysplasia: Secondary | ICD-10-CM | POA: Diagnosis not present

## 2020-04-27 DIAGNOSIS — E785 Hyperlipidemia, unspecified: Secondary | ICD-10-CM | POA: Diagnosis not present

## 2020-04-27 DIAGNOSIS — Z79899 Other long term (current) drug therapy: Secondary | ICD-10-CM | POA: Diagnosis not present

## 2020-04-27 DIAGNOSIS — Z880 Allergy status to penicillin: Secondary | ICD-10-CM | POA: Diagnosis not present

## 2020-04-27 DIAGNOSIS — Z888 Allergy status to other drugs, medicaments and biological substances status: Secondary | ICD-10-CM | POA: Diagnosis not present

## 2020-04-27 DIAGNOSIS — I10 Essential (primary) hypertension: Secondary | ICD-10-CM | POA: Diagnosis not present

## 2020-04-27 DIAGNOSIS — E119 Type 2 diabetes mellitus without complications: Secondary | ICD-10-CM | POA: Diagnosis not present

## 2020-04-27 DIAGNOSIS — Z6839 Body mass index (BMI) 39.0-39.9, adult: Secondary | ICD-10-CM | POA: Diagnosis not present

## 2020-04-27 DIAGNOSIS — Z91013 Allergy to seafood: Secondary | ICD-10-CM | POA: Diagnosis not present

## 2020-04-27 DIAGNOSIS — N72 Inflammatory disease of cervix uteri: Secondary | ICD-10-CM | POA: Diagnosis not present

## 2020-04-27 DIAGNOSIS — G4730 Sleep apnea, unspecified: Secondary | ICD-10-CM | POA: Diagnosis not present

## 2020-04-29 DIAGNOSIS — Z888 Allergy status to other drugs, medicaments and biological substances status: Secondary | ICD-10-CM | POA: Diagnosis not present

## 2020-04-29 DIAGNOSIS — E669 Obesity, unspecified: Secondary | ICD-10-CM | POA: Diagnosis not present

## 2020-04-29 DIAGNOSIS — N870 Mild cervical dysplasia: Secondary | ICD-10-CM | POA: Diagnosis not present

## 2020-04-29 DIAGNOSIS — Z6839 Body mass index (BMI) 39.0-39.9, adult: Secondary | ICD-10-CM | POA: Diagnosis not present

## 2020-04-29 DIAGNOSIS — R87810 Cervical high risk human papillomavirus (HPV) DNA test positive: Secondary | ICD-10-CM | POA: Diagnosis not present

## 2020-04-29 DIAGNOSIS — Z79899 Other long term (current) drug therapy: Secondary | ICD-10-CM | POA: Diagnosis not present

## 2020-04-29 DIAGNOSIS — Z87891 Personal history of nicotine dependence: Secondary | ICD-10-CM | POA: Diagnosis not present

## 2020-04-29 DIAGNOSIS — Z91040 Latex allergy status: Secondary | ICD-10-CM | POA: Diagnosis not present

## 2020-04-29 DIAGNOSIS — G4730 Sleep apnea, unspecified: Secondary | ICD-10-CM | POA: Diagnosis not present

## 2020-04-29 DIAGNOSIS — K219 Gastro-esophageal reflux disease without esophagitis: Secondary | ICD-10-CM | POA: Diagnosis not present

## 2020-04-29 DIAGNOSIS — N72 Inflammatory disease of cervix uteri: Secondary | ICD-10-CM | POA: Diagnosis not present

## 2020-04-29 DIAGNOSIS — E785 Hyperlipidemia, unspecified: Secondary | ICD-10-CM | POA: Diagnosis not present

## 2020-04-29 DIAGNOSIS — Z7984 Long term (current) use of oral hypoglycemic drugs: Secondary | ICD-10-CM | POA: Diagnosis not present

## 2020-04-29 DIAGNOSIS — N879 Dysplasia of cervix uteri, unspecified: Secondary | ICD-10-CM | POA: Diagnosis not present

## 2020-04-29 DIAGNOSIS — Z91013 Allergy to seafood: Secondary | ICD-10-CM | POA: Diagnosis not present

## 2020-04-29 DIAGNOSIS — E119 Type 2 diabetes mellitus without complications: Secondary | ICD-10-CM | POA: Diagnosis not present

## 2020-04-29 DIAGNOSIS — Z880 Allergy status to penicillin: Secondary | ICD-10-CM | POA: Diagnosis not present

## 2020-04-29 DIAGNOSIS — I10 Essential (primary) hypertension: Secondary | ICD-10-CM | POA: Diagnosis not present

## 2020-06-13 DIAGNOSIS — Z1231 Encounter for screening mammogram for malignant neoplasm of breast: Secondary | ICD-10-CM | POA: Diagnosis not present

## 2020-08-16 DIAGNOSIS — K219 Gastro-esophageal reflux disease without esophagitis: Secondary | ICD-10-CM | POA: Diagnosis not present

## 2020-08-16 DIAGNOSIS — E1165 Type 2 diabetes mellitus with hyperglycemia: Secondary | ICD-10-CM | POA: Diagnosis not present

## 2020-08-16 DIAGNOSIS — E782 Mixed hyperlipidemia: Secondary | ICD-10-CM | POA: Diagnosis not present

## 2020-08-17 LAB — BASIC METABOLIC PANEL
BUN: 12 (ref 4–21)
Creatinine: 0.6 (ref 0.5–1.1)
Glucose: 215

## 2020-08-17 LAB — HEMOGLOBIN A1C: Hemoglobin A1C: 8.2

## 2020-08-17 LAB — COMPREHENSIVE METABOLIC PANEL: GFR calc non Af Amer: 116

## 2020-10-14 DIAGNOSIS — Z6836 Body mass index (BMI) 36.0-36.9, adult: Secondary | ICD-10-CM | POA: Diagnosis not present

## 2020-10-14 DIAGNOSIS — E1165 Type 2 diabetes mellitus with hyperglycemia: Secondary | ICD-10-CM | POA: Diagnosis not present

## 2020-10-14 DIAGNOSIS — L03039 Cellulitis of unspecified toe: Secondary | ICD-10-CM | POA: Diagnosis not present

## 2021-03-06 DIAGNOSIS — H9202 Otalgia, left ear: Secondary | ICD-10-CM | POA: Diagnosis not present

## 2021-03-06 DIAGNOSIS — J309 Allergic rhinitis, unspecified: Secondary | ICD-10-CM | POA: Diagnosis not present

## 2021-03-08 DIAGNOSIS — E1165 Type 2 diabetes mellitus with hyperglycemia: Secondary | ICD-10-CM | POA: Diagnosis not present

## 2021-03-08 DIAGNOSIS — Z6836 Body mass index (BMI) 36.0-36.9, adult: Secondary | ICD-10-CM | POA: Diagnosis not present

## 2021-03-08 DIAGNOSIS — E782 Mixed hyperlipidemia: Secondary | ICD-10-CM | POA: Diagnosis not present

## 2021-03-08 DIAGNOSIS — G4733 Obstructive sleep apnea (adult) (pediatric): Secondary | ICD-10-CM | POA: Diagnosis not present

## 2021-03-08 LAB — HEMOGLOBIN A1C: Hemoglobin A1C: 9

## 2021-03-10 DIAGNOSIS — G4733 Obstructive sleep apnea (adult) (pediatric): Secondary | ICD-10-CM | POA: Diagnosis not present

## 2021-03-21 ENCOUNTER — Encounter: Payer: Self-pay | Admitting: Nurse Practitioner

## 2021-03-21 ENCOUNTER — Other Ambulatory Visit: Payer: Self-pay

## 2021-03-21 ENCOUNTER — Ambulatory Visit (INDEPENDENT_AMBULATORY_CARE_PROVIDER_SITE_OTHER): Payer: BC Managed Care – PPO | Admitting: Nurse Practitioner

## 2021-03-21 VITALS — BP 124/83 | HR 74 | Ht 64.0 in | Wt 223.0 lb

## 2021-03-21 DIAGNOSIS — E119 Type 2 diabetes mellitus without complications: Secondary | ICD-10-CM

## 2021-03-21 DIAGNOSIS — I10 Essential (primary) hypertension: Secondary | ICD-10-CM

## 2021-03-21 DIAGNOSIS — E782 Mixed hyperlipidemia: Secondary | ICD-10-CM

## 2021-03-21 NOTE — Patient Instructions (Signed)
Diabetes Mellitus and Nutrition, Adult When you have diabetes, or diabetes mellitus, it is very important to have healthy eating habits because your blood sugar (glucose) levels are greatly affected by what you eat and drink. Eating healthy foods in the right amounts, at about the same times every day, can help you:  Control your blood glucose.  Lower your risk of heart disease.  Improve your blood pressure.  Reach or maintain a healthy weight. What can affect my meal plan? Every person with diabetes is different, and each person has different needs for a meal plan. Your health care provider may recommend that you work with a dietitian to make a meal plan that is best for you. Your meal plan may vary depending on factors such as:  The calories you need.  The medicines you take.  Your weight.  Your blood glucose, blood pressure, and cholesterol levels.  Your activity level.  Other health conditions you have, such as heart or kidney disease. How do carbohydrates affect me? Carbohydrates, also called carbs, affect your blood glucose level more than any other type of food. Eating carbs naturally raises the amount of glucose in your blood. Carb counting is a method for keeping track of how many carbs you eat. Counting carbs is important to keep your blood glucose at a healthy level, especially if you use insulin or take certain oral diabetes medicines. It is important to know how many carbs you can safely have in each meal. This is different for every person. Your dietitian can help you calculate how many carbs you should have at each meal and for each snack. How does alcohol affect me? Alcohol can cause a sudden decrease in blood glucose (hypoglycemia), especially if you use insulin or take certain oral diabetes medicines. Hypoglycemia can be a life-threatening condition. Symptoms of hypoglycemia, such as sleepiness, dizziness, and confusion, are similar to symptoms of having too much  alcohol.  Do not drink alcohol if: ? Your health care provider tells you not to drink. ? You are pregnant, may be pregnant, or are planning to become pregnant.  If you drink alcohol: ? Do not drink on an empty stomach. ? Limit how much you use to:  0-1 drink a day for women.  0-2 drinks a day for men. ? Be aware of how much alcohol is in your drink. In the [place] one drink equals one 12 oz bottle of beer (355 mL), one 5 oz glass of wine (148 mL), or one 1 oz glass of hard liquor (44 mL). ? Keep yourself hydrated with water, diet soda, or unsweetened iced tea.  Keep in mind that regular soda, juice, and other mixers may contain a lot of sugar and must be counted as carbs. What are tips for following this plan? Reading food labels  Start by checking the serving size on the "Nutrition Facts" label of packaged foods and drinks. The amount of calories, carbs, fats, and other nutrients listed on the label is based on one serving of the item. Many items contain more than one serving per package.  Check the total grams (g) of carbs in one serving. You can calculate the number of servings of carbs in one serving by dividing the total carbs by 15. For example, if a food has 30 g of total carbs per serving, it would be equal to 2 servings of carbs.  Check the number of grams (g) of saturated fats and trans fats in one serving. Choose foods that have   a low amount or none of these fats.  Check the number of milligrams (mg) of salt (sodium) in one serving. Most people should limit total sodium intake to less than 2,300 mg per day.  Always check the nutrition information of foods labeled as "low-fat" or "nonfat." These foods may be higher in added sugar or refined carbs and should be avoided.  Talk to your dietitian to identify your daily goals for nutrients listed on the label. Shopping  Avoid buying canned, pre-made, or processed foods. These foods tend to be high in fat, sodium, and added  sugar.  Shop around the outside edge of the grocery store. This is where you will most often find fresh fruits and vegetables, bulk grains, fresh meats, and fresh dairy. Cooking  Use low-heat cooking methods, such as baking, instead of high-heat cooking methods like deep frying.  Cook using healthy oils, such as olive, canola, or sunflower oil.  Avoid cooking with butter, cream, or high-fat meats. Meal planning  Eat meals and snacks regularly, preferably at the same times every day. Avoid going long periods of time without eating.  Eat foods that are high in fiber, such as fresh fruits, vegetables, beans, and whole grains. Talk with your dietitian about how many servings of carbs you can eat at each meal.  Eat 4-6 oz (112-168 g) of lean protein each day, such as lean meat, chicken, fish, eggs, or tofu. One ounce (oz) of lean protein is equal to: ? 1 oz (28 g) of meat, chicken, or fish. ? 1 egg. ?  cup (62 g) of tofu.  Eat some foods each day that contain healthy fats, such as avocado, nuts, seeds, and fish.   What foods should I eat? Fruits Berries. Apples. Oranges. Peaches. Apricots. Plums. Grapes. Mango. Papaya. Pomegranate. Kiwi. Cherries. Vegetables Lettuce. Spinach. Leafy greens, including kale, chard, collard greens, and mustard greens. Beets. Cauliflower. Cabbage. Broccoli. Carrots. Green beans. Tomatoes. Peppers. Onions. Cucumbers. Brussels sprouts. Grains Whole grains, such as whole-wheat or whole-grain bread, crackers, tortillas, cereal, and pasta. Unsweetened oatmeal. Quinoa. Brown or wild rice. Meats and other proteins Seafood. Poultry without skin. Lean cuts of poultry and beef. Tofu. Nuts. Seeds. Dairy Low-fat or fat-free dairy products such as milk, yogurt, and cheese. The items listed above may not be a complete list of foods and beverages you can eat. Contact a dietitian for more information. What foods should I avoid? Fruits Fruits canned with  syrup. Vegetables Canned vegetables. Frozen vegetables with butter or cream sauce. Grains Refined white flour and flour products such as bread, pasta, snack foods, and cereals. Avoid all processed foods. Meats and other proteins Fatty cuts of meat. Poultry with skin. Breaded or fried meats. Processed meat. Avoid saturated fats. Dairy Full-fat yogurt, cheese, or milk. Beverages Sweetened drinks, such as soda or iced tea. The items listed above may not be a complete list of foods and beverages you should avoid. Contact a dietitian for more information. Questions to ask a health care provider  Do I need to meet with a diabetes educator?  Do I need to meet with a dietitian?  What number can I call if I have questions?  When are the best times to check my blood glucose? Where to find more information:  American Diabetes Association: diabetes.org  Academy of Nutrition and Dietetics: www.eatright.org  National Institute of Diabetes and Digestive and Kidney Diseases: www.niddk.nih.gov  Association of Diabetes Care and Education Specialists: www.diabeteseducator.org Summary  It is important to have healthy eating   habits because your blood sugar (glucose) levels are greatly affected by what you eat and drink.  A healthy meal plan will help you control your blood glucose and maintain a healthy lifestyle.  Your health care provider may recommend that you work with a dietitian to make a meal plan that is best for you.  Keep in mind that carbohydrates (carbs) and alcohol have immediate effects on your blood glucose levels. It is important to count carbs and to use alcohol carefully. This information is not intended to replace advice given to you by your health care provider. Make sure you discuss any questions you have with your health care provider. Document Revised: 11/17/2019 Document Reviewed: 11/17/2019 Elsevier Patient Education  2021 Elsevier Inc.  

## 2021-03-21 NOTE — Progress Notes (Signed)
Endocrinology Consult Note       03/21/2021, 2:37 PM   Subjective:    Patient ID: Monique Foster, female    DOB: 1976-02-27.  Monique Foster is being seen in consultation for management of currently uncontrolled symptomatic diabetes requested by  Estanislado Pandy, MD.   Past Medical History:  Diagnosis Date  . Asthma   . Diabetes mellitus without complication (HCC)   . GERD (gastroesophageal reflux disease)     Past Surgical History:  Procedure Laterality Date  . CESAREAN SECTION    . TUBAL LIGATION      Social History   Socioeconomic History  . Marital status: Married    Spouse name: Not on file  . Number of children: Not on file  . Years of education: Not on file  . Highest education level: Not on file  Occupational History  . Not on file  Tobacco Use  . Smoking status: Former Games developer  . Smokeless tobacco: Never Used  Vaping Use  . Vaping Use: Never used  Substance and Sexual Activity  . Alcohol use: No  . Drug use: No  . Sexual activity: Not on file  Other Topics Concern  . Not on file  Social History Narrative  . Not on file   Social Determinants of Health   Financial Resource Strain: Not on file  Food Insecurity: Not on file  Transportation Needs: Not on file  Physical Activity: Not on file  Stress: Not on file  Social Connections: Not on file    Family History  Problem Relation Age of Onset  . Diabetes Mother   . Osteoporosis Mother   . Diabetes Father   . Heart attack Father     Outpatient Encounter Medications as of 03/21/2021  Medication Sig  . Multiple Vitamin (MULTIVITAMIN) capsule Take 1 capsule by mouth daily.  Marland Kitchen OVER THE COUNTER MEDICATION AMbren daily for hot flashes  . atorvastatin (LIPITOR) 10 MG tablet Take 10 mg by mouth daily.  . cetirizine (ZYRTEC) 10 MG tablet Take 10 mg by mouth daily.  Marland Kitchen esomeprazole (NEXIUM 24HR) 20 MG capsule Take 20 mg by mouth daily.    Marland Kitchen losartan (COZAAR) 25 MG tablet Take 25 mg by mouth daily.  . metFORMIN (GLUCOPHAGE-XR) 500 MG 24 hr tablet Take 1,000 mg by mouth daily with breakfast.  . RYBELSUS 7 MG TABS Take 1 tablet by mouth daily.  . [DISCONTINUED] Aspirin-Acetaminophen (GOODYS BODY PAIN PO) Take 1 packet by mouth every 6 (six) hours as needed (Pain).  . [DISCONTINUED] CINNAMON PO Take 2 capsules by mouth daily.  . [DISCONTINUED] HYDROcodone-acetaminophen (NORCO/VICODIN) 5-325 MG per tablet Take 1 tablet by mouth every 4 (four) hours as needed.  . [DISCONTINUED] ibuprofen (ADVIL,MOTRIN) 800 MG tablet Take 1 tablet (800 mg total) by mouth 3 (three) times daily.  . [DISCONTINUED] pioglitazone (ACTOS) 15 MG tablet Take 15 mg by mouth daily.   No facility-administered encounter medications on file as of 03/21/2021.    ALLERGIES: Allergies  Allergen Reactions  . Iodine Shortness Of Breath and Rash  . Shellfish Allergy Shortness Of Breath, Nausea And Vomiting and Rash  . Levaquin [Levofloxacin] Swelling    Facial Area  .  Latex Itching and Rash  . Penicillins Rash    VACCINATION STATUS:  There is no immunization history on file for this patient.  Diabetes She presents for her initial diabetic visit. She has type 2 diabetes mellitus. Onset time: Diagnosed at approx age of 45. Her disease course has been fluctuating. There are no hypoglycemic associated symptoms. Associated symptoms include fatigue. There are no hypoglycemic complications. Symptoms are stable. There are no diabetic complications. Risk factors for coronary artery disease include diabetes mellitus, dyslipidemia, obesity and sedentary lifestyle. Current diabetic treatment includes oral agent (dual therapy) (Metformin, Actos, and Rybelsus). She is compliant with treatment most of the time. Her weight is fluctuating minimally. She is following a generally healthy diet. When asked about meal planning, she reported none. She has not had a previous visit with a  dietitian. She participates in exercise intermittently. Her home blood glucose trend is decreasing steadily. Her breakfast blood glucose range is generally 180-200 mg/dl. Her bedtime blood glucose range is generally >200 mg/dl. Her overall blood glucose range is >200 mg/dl. (She presents today for her consultation with her logs to review showing improving yet still above target fasting and postprandial glycemic profile.  Her most recent A1c was 9% on 3/16.  Her husband is a patient here at [facility] and she sat with him for diet education with Boyd Kerbs.  She has already started to implement the information she received into her everyday life.  She reports she monitors glucose twice daily, eats 3 meals and snacks and drinks mostly water (with occasional vitamin water or V8 juice energy for the caffeine).  She does engage in routine physical activity at least 3 times per week.  She denies any hypoglycemia.) An ACE inhibitor/angiotensin II receptor blocker is being taken. She does not see a podiatrist.Eye exam is current.  Hyperlipidemia This is a chronic problem. The current episode started more than 1 year ago. The problem is controlled. Recent lipid tests were reviewed and are normal. Exacerbating diseases include diabetes and obesity. There are no known factors aggravating her hyperlipidemia. Current antihyperlipidemic treatment includes statins. The current treatment provides mild improvement of lipids. Compliance problems include adherence to diet and adherence to exercise.  Risk factors for coronary artery disease include diabetes mellitus, dyslipidemia and obesity.  Hypertension This is a chronic problem. The current episode started more than 1 year ago. The problem is unchanged. The problem is controlled. There are no associated agents to hypertension. Risk factors for coronary artery disease include diabetes mellitus, dyslipidemia and obesity. Past treatments include nothing. Compliance problems include diet and  exercise.  Identifiable causes of hypertension include sleep apnea.     Review of systems  Constitutional: + Minimally fluctuating body weight,  current Body mass index is 38.28 kg/m. , no fatigue, no subjective hyperthermia, no subjective hypothermia Eyes: no blurry vision, no xerophthalmia ENT: no sore throat, no nodules palpated in throat, no dysphagia/odynophagia, no hoarseness Cardiovascular: no chest pain, no shortness of breath, no palpitations, no leg swelling Respiratory: no cough, no shortness of breath Gastrointestinal: no nausea/vomiting/diarrhea Musculoskeletal: no muscle/joint aches Skin: no rashes, no hyperemia Neurological: no tremors, no numbness, no tingling, no dizziness Psychiatric: no depression, no anxiety  Objective:     BP 124/83   Pulse 74   Ht 5\' 4"  (1.626 m)   Wt 223 lb (101.2 kg)   BMI 38.28 kg/m   Wt Readings from Last 3 Encounters:  03/21/21 223 lb (101.2 kg)  02/11/15 219 lb (99.3 kg)  11/23/14  225 lb (102.1 kg)     BP Readings from Last 3 Encounters:  03/21/21 124/83  02/11/15 124/77  11/23/14 122/78     Physical Exam- Limited  Constitutional:  Body mass index is 38.28 kg/m. , not in acute distress, normal state of mind Eyes:  EOMI, no exophthalmos Neck: Supple Cardiovascular: RRR, no murmers, rubs, or gallops, no edema Respiratory: Adequate breathing efforts, no crackles, rales, rhonchi, or wheezing Musculoskeletal: no gross deformities, strength intact in all four extremities, no gross restriction of joint movements Skin:  no rashes, no hyperemia Neurological: no tremor with outstretched hands    CMP ( most recent) CMP     Component Value Date/Time   NA 135 04/15/2012 0247   K 3.7 04/15/2012 0247   CL 97 04/15/2012 0247   CO2 28 04/15/2012 0247   GLUCOSE 141 (H) 04/15/2012 0247   BUN 12 08/17/2020 0000   CREATININE 0.6 08/17/2020 0000   CREATININE 0.67 04/15/2012 0247   CALCIUM 9.1 04/15/2012 0247   PROT 7.3  04/15/2012 0247   ALBUMIN 3.5 04/15/2012 0247   AST 16 04/15/2012 0247   ALT 18 04/15/2012 0247   ALKPHOS 56 04/15/2012 0247   BILITOT 0.7 04/15/2012 0247   GFRNONAA 116 08/17/2020 0000   GFRAA >90 04/15/2012 0247     Diabetic Labs (most recent): Lab Results  Component Value Date   HGBA1C 9.0 03/08/2021   HGBA1C 8.2 08/17/2020     Lipid Panel ( most recent) Lipid Panel  No results found for: CHOL, TRIG, HDL, CHOLHDL, VLDL, LDLCALC, LDLDIRECT, LABVLDL    No results found for: TSH, FREET4         Assessment & Plan:   1) Type 2 diabetes mellitus without complication, without long-term current use of insulin (HCC)  She presents today for her consultation with her logs to review showing improving yet still above target fasting and postprandial glycemic profile.  Her most recent A1c was 9% on 3/16.  Her husband is a patient here at [facility] and she sat with him for diet education with Boyd Kerbs.  She has already started to implement the information she received into her everyday life.  She reports she monitors glucose twice daily, eats 3 meals and snacks and drinks mostly water (with occasional vitamin water or V8 juice energy for the caffeine).  She does engage in routine physical activity at least 3 times per week.  She denies any hypoglycemia.  She does admit to snacking in the afternoon due to extreme hunger.  - Monique Foster has currently uncontrolled symptomatic type 2 DM since 45 years of age, with most recent A1c of 9 %.   -Recent labs reviewed.  - I had a long discussion with her about the progressive nature of diabetes and the pathology behind its complications. -her diabetes is not currently complicated but she remains at a high risk for more acute and chronic complications which include CAD, CVA, CKD, retinopathy, and neuropathy. These are all discussed in detail with her.  - I have counseled her on diet  and weight management by adopting a carbohydrate restricted/protein rich  diet. Patient is encouraged to switch to unprocessed or minimally processed complex starch and increased protein intake (animal or plant source), fruits, and vegetables. -  she is advised to stick to a routine mealtimes to eat 3 meals a day and avoid unnecessary snacks (to snack only to correct hypoglycemia).   - she acknowledges that there is a room for improvement in her  food and drink choices. - Suggestion is made for her to avoid simple carbohydrates  from her diet including Cakes, Sweet Desserts, Ice Cream, Soda (diet and regular), Sweet Tea, Candies, Chips, Cookies, Store Bought Juices, Alcohol in Excess of  1-2 drinks a day, Artificial Sweeteners, Coffee Creamer, and "Sugar-free" Products. This will help patient to have more stable blood glucose profile and potentially avoid unintended weight gain.  - she will be scheduled with Norm SaltPenny Crumpton, RDN, CDE for diabetes education.  - I have approached her with the following individualized plan to manage  her diabetes and patient agrees:   -she is encouraged to start monitoring glucose 4 times daily, before meals and before bed, to log their readings on the clinic sheets provided, and bring them to review at follow up appointment in 2 weeks.  - Adjustment parameters are given to her for hypo and hyperglycemia in writing. - she is encouraged to call clinic for blood glucose levels less than 70 or above 300 mg /dl. - she is advised to continue Rybelsus 7 mg po daily, therapeutically suitable for patient.    Will lower her dose of Metformin 1000 mg ER to once daily, not BID and discontinue her Actos 15 mg.  - Specific targets for  A1c;  LDL, HDL,  and Triglycerides were discussed with the patient.  2) Blood Pressure /Hypertension:  her blood pressure is controlled to target.  She is advised to continue Losartan 25 mg po daily.  3) Lipids/Hyperlipidemia:    There is no recent lipid panel available to review.  She is advised to continue Lipitor  10 mg po daily at bedtime.  Side effects and precautions discussed with her.  Will recheck lipid panel on subsequent visits.  4)  Weight/Diet:  her Body mass index is 38.28 kg/m.  -  clearly complicating her diabetes care.   she is a candidate for weight loss. I discussed with her the fact that loss of 5 - 10% of her  current body weight will have the most impact on her diabetes management.  Exercise, and detailed carbohydrates information provided  -  detailed on discharge instructions.  5) Chronic Care/Health Maintenance: -she is on Statin medications and is encouraged to initiate and continue to follow up with Ophthalmology, Dentist, Podiatrist at least yearly or according to recommendations, and advised to stay away from smoking. I have recommended yearly flu vaccine and pneumonia vaccine at least every 5 years; moderate intensity exercise for up to 150 minutes weekly; and sleep for at least 7 hours a day.  - she is advised to maintain close follow up with Sasser, Clarene CritchleyPaul W, MD for primary care needs, as well as her other providers for optimal and coordinated care.   - Time spent in this patient care: 60 min, of which > 50% was spent in counseling her about her diabetes and the rest reviewing her blood glucose logs, discussing her hypoglycemia and hyperglycemia episodes, reviewing her current and previous labs/studies (including abstraction from other facilities) and medications doses and developing a long term treatment plan based on the latest standards of care/guidelines; and documenting her care.    Please refer to Patient Instructions for Blood Glucose Monitoring and Insulin/Medications Dosing Guide" in media tab for additional information. Please also refer to "Patient Self Inventory" in the Media tab for reviewed elements of pertinent patient history.  Monique HollingsheadAngel L Bellis participated in the discussions, expressed understanding, and voiced agreement with the above plans.  All questions were  answered  to her satisfaction. she is encouraged to contact clinic should she have any questions or concerns prior to her return visit.   Follow up plan: - Return in about 2 weeks (around 04/04/2021) for Diabetes follow up, Bring glucometer and logs, No previsit labs, ABI next visit.  Ronny Bacon, [facility] [address] [place], [place] [address] Phone: [phone] Fax: [phone]  03/21/2021, 2:37 PM

## 2021-04-03 NOTE — Patient Instructions (Signed)

## 2021-04-04 ENCOUNTER — Other Ambulatory Visit: Payer: Self-pay

## 2021-04-04 ENCOUNTER — Encounter: Payer: Self-pay | Admitting: Nurse Practitioner

## 2021-04-04 ENCOUNTER — Ambulatory Visit (INDEPENDENT_AMBULATORY_CARE_PROVIDER_SITE_OTHER): Payer: BC Managed Care – PPO | Admitting: Nurse Practitioner

## 2021-04-04 VITALS — BP 121/71 | HR 72 | Ht 64.0 in | Wt 222.6 lb

## 2021-04-04 DIAGNOSIS — E119 Type 2 diabetes mellitus without complications: Secondary | ICD-10-CM

## 2021-04-04 DIAGNOSIS — I10 Essential (primary) hypertension: Secondary | ICD-10-CM | POA: Diagnosis not present

## 2021-04-04 DIAGNOSIS — E782 Mixed hyperlipidemia: Secondary | ICD-10-CM

## 2021-04-04 MED ORDER — GLIPIZIDE ER 5 MG PO TB24: 5.0000 mg | ORAL_TABLET | Freq: Every day | ORAL | 3 refills | Status: DC

## 2021-04-04 NOTE — Progress Notes (Signed)
Endocrinology Follow Up Note       04/04/2021, 3:32 PM   Subjective:    Patient ID: Monique Foster, female    DOB: [birth year].  Monique Foster is being seen in follow up after being seen in consultation for management of currently uncontrolled symptomatic diabetes requested by  Estanislado PandySasser, Paul W, MD.   Past Medical History:  Diagnosis Date  . Asthma   . Diabetes mellitus without complication (HCC)   . GERD (gastroesophageal reflux disease)     Past Surgical History:  Procedure Laterality Date  . CESAREAN SECTION    . TUBAL LIGATION      Social History   Socioeconomic History  . Marital status: Married    Spouse name: Not on file  . Number of children: Not on file  . Years of education: Not on file  . Highest education level: Not on file  Occupational History  . Not on file  Tobacco Use  . Smoking status: Former Games developermoker  . Smokeless tobacco: Never Used  Vaping Use  . Vaping Use: Never used  Substance and Sexual Activity  . Alcohol use: No  . Drug use: No  . Sexual activity: Not on file  Other Topics Concern  . Not on file  Social History Narrative  . Not on file   Social Determinants of Health   Financial Resource Strain: Not on file  Food Insecurity: Not on file  Transportation Needs: Not on file  Physical Activity: Not on file  Stress: Not on file  Social Connections: Not on file    Family History  Problem Relation Age of Onset  . Diabetes Mother   . Osteoporosis Mother   . Diabetes Father   . Heart attack Father     Outpatient Encounter Medications as of 04/04/2021  Medication Sig  . atorvastatin (LIPITOR) 10 MG tablet Take 10 mg by mouth daily.  . cetirizine (ZYRTEC) 10 MG tablet Take 10 mg by mouth daily.  Marland Kitchen. esomeprazole (NEXIUM) 20 MG capsule Take 20 mg by mouth daily.   Marland Kitchen. glipiZIDE (GLUCOTROL XL) 5 MG 24 hr tablet Take 1 tablet (5 mg total) by mouth daily with breakfast.  .  losartan (COZAAR) 25 MG tablet Take 25 mg by mouth daily.  . metFORMIN (GLUCOPHAGE-XR) 500 MG 24 hr tablet Take 1,000 mg by mouth daily with breakfast.  . Multiple Vitamin (MULTIVITAMIN) capsule Take 1 capsule by mouth daily.  Marland Kitchen. OVER THE COUNTER MEDICATION AMbren daily for hot flashes  . RYBELSUS 7 MG TABS Take 1 tablet by mouth daily.   No facility-administered encounter medications on file as of 04/04/2021.    ALLERGIES: Allergies  Allergen Reactions  . Iodine Shortness Of Breath and Rash  . Shellfish Allergy Shortness Of Breath, Nausea And Vomiting and Rash  . Levaquin [Levofloxacin] Swelling    Facial Area  . Latex Itching and Rash  . Penicillins Rash    VACCINATION STATUS:  There is no immunization history on file for this patient.  Diabetes She presents for her follow-up diabetic visit. She has type 2 diabetes mellitus. Onset time: Diagnosed at approx age of 45. Her disease course has been stable. There are no  hypoglycemic associated symptoms. Associated symptoms include fatigue. There are no hypoglycemic complications. Symptoms are stable. There are no diabetic complications. Risk factors for coronary artery disease include diabetes mellitus, dyslipidemia, obesity and sedentary lifestyle. Current diabetic treatment includes oral agent (dual therapy). She is compliant with treatment most of the time. Her weight is fluctuating minimally. She is following a generally healthy diet. When asked about meal planning, she reported none. She has not had a previous visit with a dietitian. She participates in exercise intermittently. Her home blood glucose trend is fluctuating minimally. Her breakfast blood glucose range is generally >200 mg/dl. Her lunch blood glucose range is generally 180-200 mg/dl. Her dinner blood glucose range is generally >200 mg/dl. Her bedtime blood glucose range is generally 180-200 mg/dl. Her overall blood glucose range is >200 mg/dl. (She presents today with her logs,  no meter, showing minimal improvement in her glucose both fasting and postprandially.  She reports she is doing better with her diet and exercise and she is feeling better than before.  There are no episodes of hypoglycemia noted.) An ACE inhibitor/angiotensin II receptor blocker is being taken. She does not see a podiatrist.Eye exam is current.  Hyperlipidemia This is a chronic problem. The current episode started more than 1 year ago. The problem is controlled. Recent lipid tests were reviewed and are normal. Exacerbating diseases include diabetes and obesity. There are no known factors aggravating her hyperlipidemia. Current antihyperlipidemic treatment includes statins. The current treatment provides mild improvement of lipids. Compliance problems include adherence to diet and adherence to exercise.  Risk factors for coronary artery disease include diabetes mellitus, dyslipidemia and obesity.  Hypertension This is a chronic problem. The current episode started more than 1 year ago. The problem is unchanged. The problem is controlled. There are no associated agents to hypertension. Risk factors for coronary artery disease include diabetes mellitus, dyslipidemia and obesity. Past treatments include nothing. Compliance problems include diet and exercise.  Identifiable causes of hypertension include sleep apnea.     Review of systems  Constitutional: + Minimally fluctuating body weight,  current Body mass index is 38.21 kg/m. , + fatigue, no subjective hyperthermia, no subjective hypothermia Eyes: no blurry vision, no xerophthalmia ENT: no sore throat, no nodules palpated in throat, no dysphagia/odynophagia, no hoarseness Cardiovascular: no chest pain, no shortness of breath, no palpitations, no leg swelling Respiratory: no cough, no shortness of breath Gastrointestinal: no nausea/vomiting/diarrhea Musculoskeletal: no muscle/joint aches Skin: no rashes, no hyperemia Neurological: no tremors, no  numbness, no tingling, no dizziness Psychiatric: no depression, no anxiety  Objective:     BP 121/71 (BP Location: Left Arm)   Pulse 72   Ht 5\' 4"  (1.626 m)   Wt 222 lb 9.6 oz (101 kg)   BMI 38.21 kg/m   Wt Readings from Last 3 Encounters:  04/04/21 222 lb 9.6 oz (101 kg)  03/21/21 223 lb (101.2 kg)  02/11/15 219 lb (99.3 kg)     BP Readings from Last 3 Encounters:  04/04/21 121/71  03/21/21 124/83  02/11/15 124/77      Physical Exam- Limited  Constitutional:  Body mass index is 38.21 kg/m. , not in acute distress, normal state of mind Eyes:  EOMI, no exophthalmos Neck: Supple Cardiovascular: RRR, no murmers, rubs, or gallops, no edema Respiratory: Adequate breathing efforts, no crackles, rales, rhonchi, or wheezing Musculoskeletal: no gross deformities, strength intact in all four extremities, no gross restriction of joint movements Skin:  no rashes, no hyperemia Neurological: no tremor with  outstretched hands   POCT ABI Results 04/04/21   Right ABI:  1.17      Left ABI:  1.20  Right leg systolic / diastolic: 141/75 mmHg Left leg systolic / diastolic: 145/79 mmHg  Arm systolic / diastolic: 121/71 mmHG  Detailed report will be scanned into patient chart.    CMP ( most recent) CMP     Component Value Date/Time   NA 135 04/15/2012 0247   K 3.7 04/15/2012 0247   CL 97 04/15/2012 0247   CO2 28 04/15/2012 0247   GLUCOSE 141 (H) 04/15/2012 0247   BUN 12 08/17/2020 0000   CREATININE 0.6 08/17/2020 0000   CREATININE 0.67 04/15/2012 0247   CALCIUM 9.1 04/15/2012 0247   PROT 7.3 04/15/2012 0247   ALBUMIN 3.5 04/15/2012 0247   AST 16 04/15/2012 0247   ALT 18 04/15/2012 0247   ALKPHOS 56 04/15/2012 0247   BILITOT 0.7 04/15/2012 0247   GFRNONAA 116 08/17/2020 0000   GFRAA >90 04/15/2012 0247     Diabetic Labs (most recent): Lab Results  Component Value Date   HGBA1C 9.0 03/08/2021   HGBA1C 8.2 08/17/2020     Lipid Panel ( most recent) Lipid Panel   No results found for: CHOL, TRIG, HDL, CHOLHDL, VLDL, LDLCALC, LDLDIRECT, LABVLDL    No results found for: TSH, FREET4         Assessment & Plan:   1) Type 2 diabetes mellitus without complication, without long-term current use of insulin (HCC)  She presents today with her logs, no meter, showing minimal improvement in her glucose both fasting and postprandially.  She reports she is doing better with her diet and exercise and she is feeling better than before.  There are no episodes of hypoglycemia noted.  - Monique Foster has currently uncontrolled symptomatic type 2 DM since 45 years of age, with most recent A1c of 9 %.   -Recent labs reviewed.  - I had a long discussion with her about the progressive nature of diabetes and the pathology behind its complications. -her diabetes is not currently complicated but she remains at a high risk for more acute and chronic complications which include CAD, CVA, CKD, retinopathy, and neuropathy. These are all discussed in detail with her.  - Nutritional counseling repeated at each appointment due to patients tendency to fall back in to old habits.  - The patient admits there is a room for improvement in their diet and drink choices. -  Suggestion is made for the patient to avoid simple carbohydrates from their diet including Cakes, Sweet Desserts / Pastries, Ice Cream, Soda (diet and regular), Sweet Tea, Candies, Chips, Cookies, Sweet Pastries,  Store Bought Juices, Alcohol in Excess of  1-2 drinks a day, Artificial Sweeteners, Coffee Creamer, and "Sugar-free" Products. This will help patient to have stable blood glucose profile and potentially avoid unintended weight gain.   - I encouraged the patient to switch to  unprocessed or minimally processed complex starch and increased protein intake (animal or plant source), fruits, and vegetables.   - Patient is advised to stick to a routine mealtimes to eat 3 meals  a day and avoid unnecessary snacks  ( to snack only to correct hypoglycemia).  - she will be scheduled with Norm Salt, RDN, CDE for diabetes education.  - I have approached her with the following individualized plan to manage  her diabetes and patient agrees:    - she is advised to continue Rybelsus 7 mg po daily,  therapeutically suitable for patient and continue Metformin 1000 mg ER daily with breakfast.   -Given her consistent hyperglycemia and wishes to avoid insulin, I discussed and initiated low dose Glipizide 5 mg XL daily with breakfast.  -She is encouraged to continue monitoring blood glucose twice daily, before breakfast and before bed, and to call the clinic if she has readings less than 70 or greater than 300 for 3 tests in a row.  - Specific targets for  A1c;  LDL, HDL,  and Triglycerides were discussed with the patient.  2) Blood Pressure /Hypertension:  her blood pressure is controlled to target.  She is advised to continue Losartan 25 mg po daily.  3) Lipids/Hyperlipidemia:    There is no recent lipid panel available to review.  She is advised to continue Lipitor 10 mg po daily at bedtime.  Side effects and precautions discussed with her.  Will recheck lipid panel on subsequent visits.  4)  Weight/Diet:  her Body mass index is 38.21 kg/m.  -  clearly complicating her diabetes care.   she is a candidate for weight loss. I discussed with her the fact that loss of 5 - 10% of her  current body weight will have the most impact on her diabetes management.  Exercise, and detailed carbohydrates information provided  -  detailed on discharge instructions.  5) Chronic Care/Health Maintenance: -she is on Statin medications and is encouraged to initiate and continue to follow up with Ophthalmology, Dentist, Podiatrist at least yearly or according to recommendations, and advised to stay away from smoking. I have recommended yearly flu vaccine and pneumonia vaccine at least every 5 years; moderate intensity exercise for  up to 150 minutes weekly; and sleep for at least 7 hours a day.  - she is advised to maintain close follow up with Sasser, Clarene Critchley, MD for primary care needs, as well as her other providers for optimal and coordinated care.     I spent 35 minutes in the care of the patient today including review of labs from CMP, Lipids, Thyroid Function, Hematology (current and previous including abstractions from other facilities); face-to-face time discussing  her blood glucose readings/logs, discussing hypoglycemia and hyperglycemia episodes and symptoms, medications doses, her options of short and long term treatment based on the latest standards of care / guidelines;  discussion about incorporating lifestyle medicine;  and documenting the encounter.    Please refer to Patient Instructions for Blood Glucose Monitoring and Insulin/Medications Dosing Guide"  in media tab for additional information. Please  also refer to " Patient Self Inventory" in the Media  tab for reviewed elements of pertinent patient history.  Monique Foster participated in the discussions, expressed understanding, and voiced agreement with the above plans.  All questions were answered to her satisfaction. she is encouraged to contact clinic should she have any questions or concerns prior to her return visit.  Follow up plan: - Return in about 3 months (around 07/04/2021) for Diabetes follow up, No previsit labs, Bring glucometer and logs.  Ronny Bacon, [facility] [address] [place], [place] [address] Phone: [phone] Fax: [phone]  04/04/2021, 3:32 PM

## 2021-05-13 DIAGNOSIS — L03031 Cellulitis of right toe: Secondary | ICD-10-CM | POA: Diagnosis not present

## 2021-05-15 ENCOUNTER — Other Ambulatory Visit: Payer: Self-pay

## 2021-05-15 ENCOUNTER — Emergency Department (HOSPITAL_COMMUNITY)
Admission: EM | Admit: 2021-05-15 | Discharge: 2021-05-15 | Disposition: A | Discharge status: Home / Self Care | Payer: BC Managed Care – PPO | Attending: Emergency Medicine | Admitting: Emergency Medicine

## 2021-05-15 ENCOUNTER — Encounter (HOSPITAL_COMMUNITY): Payer: Self-pay | Admitting: *Deleted

## 2021-05-15 DIAGNOSIS — E119 Type 2 diabetes mellitus without complications: Secondary | ICD-10-CM | POA: Diagnosis not present

## 2021-05-15 DIAGNOSIS — Z7984 Long term (current) use of oral hypoglycemic drugs: Secondary | ICD-10-CM | POA: Insufficient documentation

## 2021-05-15 DIAGNOSIS — L03031 Cellulitis of right toe: Secondary | ICD-10-CM | POA: Insufficient documentation

## 2021-05-15 DIAGNOSIS — L600 Ingrowing nail: Secondary | ICD-10-CM | POA: Insufficient documentation

## 2021-05-15 DIAGNOSIS — J45909 Unspecified asthma, uncomplicated: Secondary | ICD-10-CM | POA: Insufficient documentation

## 2021-05-15 DIAGNOSIS — M7989 Other specified soft tissue disorders: Secondary | ICD-10-CM | POA: Diagnosis not present

## 2021-05-15 DIAGNOSIS — Z87891 Personal history of nicotine dependence: Secondary | ICD-10-CM | POA: Insufficient documentation

## 2021-05-15 DIAGNOSIS — Z91040 Latex allergy status: Secondary | ICD-10-CM | POA: Diagnosis not present

## 2021-05-15 MED ORDER — LIDOCAINE-EPINEPHRINE (PF) 2 %-1:200000 IJ SOLN
10.0000 mL | Freq: Once | INTRAMUSCULAR | Status: AC
  Administered 2021-05-15: 10 mL
  Filled 2021-05-15: qty 10

## 2021-05-15 MED ORDER — SULFAMETHOXAZOLE-TRIMETHOPRIM 800-160 MG PO TABS: 1.0000 | ORAL_TABLET | Freq: Two times a day (BID) | ORAL | 0 refills | Status: AC

## 2021-05-15 NOTE — ED Triage Notes (Signed)
Pt c/o right toe pain and swelling that has gotten progressively worse since Friday; pt has been seen by her PCP and given anitibiotics

## 2021-05-15 NOTE — Discharge Instructions (Addendum)
Continue taking Keflex as prescribed, add on Bactrim and take until course is completed. Use Tylenol or ibuprofen as needed for pain. Use ice to help with pain and swelling. Follow-up with a podiatrist.  There is information for the person who is on-call here in [place], or you may follow-up with the [facility] if desired. Return to the emergency room you develop high fevers, severe worsening pain, redness streaking up your leg, or any new, worsening, or concerning symptoms.

## 2021-05-15 NOTE — ED Provider Notes (Signed)
[facility] EMERGENCY DEPARTMENT Provider Note   CSN: [id number] Arrival date & time: 05/15/21  1039     History Chief Complaint  Patient presents with  . Nail Problem    Monique Foster is a 45 y.o. female presenting for evaluation of right great toe pain and swelling.  Patient states for the past week, she has had gradually worsening pain and swelling of her right great toe.  She has had this occur multiple times over the past couple months, been treated with antibiotics by her PCP multiple times.  She was started on Keflex on Friday, has had no improvement.  Denies fevers, chills, nausea, vomiting.  No other injuries.  It started to drain some bloody pus yesterday.  HPI     Past Medical History:  Diagnosis Date  . Asthma   . Diabetes mellitus without complication (HCC)   . GERD (gastroesophageal reflux disease)     There are no problems to display for this patient.   Past Surgical History:  Procedure Laterality Date  . CESAREAN SECTION    . TUBAL LIGATION       OB History   No obstetric history on file.     Family History  Problem Relation Age of Onset  . Diabetes Mother   . Osteoporosis Mother   . Diabetes Father   . Heart attack Father     Social History   Tobacco Use  . Smoking status: Former Games developer  . Smokeless tobacco: Never Used  Vaping Use  . Vaping Use: Never used  Substance Use Topics  . Alcohol use: No  . Drug use: No    Home Medications Prior to Admission medications   Medication Sig Start Date End Date Taking? Authorizing Provider  sulfamethoxazole-trimethoprim (BACTRIM DS) 800-160 MG tablet Take 1 tablet by mouth 2 (two) times daily for 7 days. 05/15/21 05/22/21 Yes Mersadie Kavanaugh, PA-C  atorvastatin (LIPITOR) 10 MG tablet Take 10 mg by mouth daily. 03/19/21   [provider]  cetirizine (ZYRTEC) 10 MG tablet Take 10 mg by mouth daily.    [provider]  esomeprazole (NEXIUM) 20 MG capsule Take 20 mg by mouth daily.      [provider]  glipiZIDE (GLUCOTROL XL) 5 MG 24 hr tablet Take 1 tablet (5 mg total) by mouth daily with breakfast. 04/04/21   Dani Gobble, NP  losartan (COZAAR) 25 MG tablet Take 25 mg by mouth daily. 03/20/21   [provider]  metFORMIN (GLUCOPHAGE-XR) 500 MG 24 hr tablet Take 1,000 mg by mouth daily with breakfast. 12/30/14   [provider]  Multiple Vitamin (MULTIVITAMIN) capsule Take 1 capsule by mouth daily.    [provider]  OVER THE COUNTER MEDICATION AMbren daily for hot flashes    [provider]  RYBELSUS 7 MG TABS Take 1 tablet by mouth daily. 03/19/21   [provider]    Allergies    Iodine, Shellfish allergy, Levaquin [levofloxacin], Latex, and Penicillins  Review of Systems   Review of Systems  Constitutional: Negative for fever.  Musculoskeletal: Positive for arthralgias and joint swelling.  Skin: Positive for color change.    Physical Exam Updated Vital Signs BP 129/90   Pulse 72   Temp 98.3 F (36.8 C) (Oral)   Resp 18   Ht 5\' 4"  (1.626 m)   Wt 100.7 kg   LMP 05/01/2021   SpO2 100%   BMI 38.11 kg/m   Physical Exam Vitals and nursing note  reviewed.  Constitutional:      General: She is not in acute distress.    Appearance: She is well-developed.  HENT:     Head: Normocephalic and atraumatic.  Pulmonary:     Effort: Pulmonary effort is normal.  Abdominal:     General: There is no distension.  Musculoskeletal:        General: Normal range of motion.     Cervical back: Normal range of motion.  Skin:    General: Skin is warm.     Capillary Refill: Capillary refill takes less than 2 seconds.     Findings: No rash.     Comments: See pictures below.  Erythema, warmth, swelling, tenderness of the right great toe on the medial aspect.  Drainage noted.  Good distal sensation.  Neurological:     Mental Status: She is alert and oriented to person, place, and time.          ED Results  / Procedures / Treatments   Labs (all labs ordered are listed, but only abnormal results are displayed) Labs Reviewed - No data to display  EKG None  Radiology No results found.  Procedures Drain paronychia  Date/Time: 05/15/2021 2:59 PM Performed by: Alveria Apley, PA-C Authorized by: Alveria Apley, PA-C  Consent: Verbal consent obtained. Risks and benefits: risks, benefits and alternatives were discussed Consent given by: patient Preparation: Patient was prepped and draped in the usual sterile fashion. Local anesthesia used: yes Anesthesia: digital block  Anesthesia: Local anesthesia used: yes Local Anesthetic: lidocaine 2% with epinephrine Anesthetic total: 8 mL  Sedation: Patient sedated: no  Patient tolerance: patient tolerated the procedure well with no immediate complications  Excise ingrown toenail  Date/Time: 05/15/2021 2:59 PM Performed by: Alveria Apley, PA-C Authorized by: Alveria Apley, PA-C  Consent: Verbal consent obtained. Risks and benefits: risks, benefits and alternatives were discussed Consent given by: patient Preparation: Patient was prepped and draped in the usual sterile fashion. Local anesthesia used: yes Anesthesia: digital block  Anesthesia: Local anesthesia used: yes Local Anesthetic: lidocaine 2% with epinephrine Anesthetic total: 8 mL  Sedation: Patient sedated: no  Patient tolerance: patient tolerated the procedure well with no immediate complications      Medications Ordered in ED Medications  lidocaine-EPINEPHrine (XYLOCAINE W/EPI) 2 %-1:200000 (PF) injection 10 mL (10 mLs Infiltration Given 05/15/21 1331)    ED Course  I have reviewed the triage vital signs and the nursing notes.  Pertinent labs & imaging results that were available during my care of the patient were reviewed by me and considered in my medical decision making (see chart for details).    MDM Rules/Calculators/A&P                           Patient resenting for evaluation of right great toe pain and swelling.  On exam, patient peers nontoxic.  No systemic signs of infection.  No fevers.  On exam, toe was infected with signs of paronychia.  She may also have an ingrown toenail, though difficult to assess with the amount of inflammation.  Will need I&D of paronychia and washout, plan for f/u with podiatry.   Digital block of the right great toe performed using ring block technique.  Patient with complaint anesthesia of the great toe.  Area was cleaned and sterilely prepped, using a 15 blade scalpel incision was made along the medial aspect of the right great toenail.  Drainage was purulent and bloody.  With  decreased swelling, there is evidence of ingrown toenail, and the medial aspect of the right great toe was separated from the nailbed and partially removed.  Patient tolerated well.  Discussed aftercare instructions.  Encourage patient to follow-up with podiatry, resources given.  Will add on Bactrim for improved antibiotic coverage.  At this time, patient appears safe for discharge.  Return precautions given.  Patient states she understands and agrees to plan.  Final Clinical Impression(s) / ED Diagnoses Final diagnoses:  Paronychia of great toe, right  Ingrown nail of great toe of right foot    Rx / DC Orders ED Discharge Orders         Ordered    sulfamethoxazole-trimethoprim (BACTRIM DS) 800-160 MG tablet  2 times daily        05/15/21 1422           Charly Holcomb, PA-C 05/15/21 1501    Vanetta Mulders, MD 05/20/21 [phone]

## 2021-05-19 ENCOUNTER — Other Ambulatory Visit: Payer: Self-pay

## 2021-05-19 ENCOUNTER — Ambulatory Visit (INDEPENDENT_AMBULATORY_CARE_PROVIDER_SITE_OTHER): Payer: BC Managed Care – PPO | Admitting: Podiatry

## 2021-05-19 ENCOUNTER — Ambulatory Visit (INDEPENDENT_AMBULATORY_CARE_PROVIDER_SITE_OTHER): Payer: BC Managed Care – PPO

## 2021-05-19 VITALS — BP 122/88 | HR 82 | Temp 98.6°F

## 2021-05-19 DIAGNOSIS — L03031 Cellulitis of right toe: Secondary | ICD-10-CM | POA: Diagnosis not present

## 2021-05-19 DIAGNOSIS — L600 Ingrowing nail: Secondary | ICD-10-CM | POA: Diagnosis not present

## 2021-05-19 MED ORDER — CEPHALEXIN 500 MG PO CAPS: 500.0000 mg | ORAL_CAPSULE | Freq: Three times a day (TID) | ORAL | 0 refills | Status: DC

## 2021-05-19 NOTE — Patient Instructions (Signed)

## 2021-05-19 NOTE — Progress Notes (Signed)
Subjective:   Patient ID: Monique Foster, female   DOB: 44 y.o.   MRN: [id number]   HPI Patient presents stating she has had a lot of pain in her right big toe with long-term history ingrown toenails diabetes not in great control with A1c around 9 and is on antibiotics after going to the emergency room on Monday.  States her toe is still sore and draining mostly on the inside of the toe.  Patient does not currently smoke is moderately obese and is not active   Review of Systems  All other systems reviewed and are negative.       Objective:  Physical Exam Vitals and nursing note reviewed.  Constitutional:      Appearance: She is well-developed.  Pulmonary:     Effort: Pulmonary effort is normal.  Musculoskeletal:        General: Normal range of motion.  Skin:    General: Skin is warm.  Neurological:     Mental Status: She is alert.     Neurovascular status was found to be intact muscle strength is mildly reduced range of motion within normal limits with mild edema in her feet normal with patient found to have a red swollen right hallux from the inner phalangeal joint distal both medial lateral and within the center portion of the nail with nail looseness and drainage mostly on the medial side localized.  Patient is on Bactrim and cephalexin currently     Assessment:  High-grade paronychia infection right big toe involving the entire toe both medial and lateral sides along with ingrown toenail chronic medial border right hallux     Plan:  H&P reviewed condition great length took precautionary x-ray to rule out bone infection went ahead today and anesthetized the right hallux.  I remove the medial lateral border I cleaned out necrotic tissue from both sides flushed the area found the center nail to be loose and went ahead and removed it flushed the bed and applied sterile dressing.  Gave instructions for soaks continue cephalexin for approximate 7 to 10 days and will let us know  immediately if any further redness or other pathology were to occur and understands ultimately amputation may be necessary if she develops severe infection or bone infection  X-rays negative so far for osteolysis or bony pathology

## 2021-07-10 NOTE — Patient Instructions (Signed)

## 2021-07-11 ENCOUNTER — Encounter: Payer: Self-pay | Admitting: Nurse Practitioner

## 2021-07-11 ENCOUNTER — Other Ambulatory Visit: Payer: Self-pay

## 2021-07-11 ENCOUNTER — Ambulatory Visit (INDEPENDENT_AMBULATORY_CARE_PROVIDER_SITE_OTHER): Payer: BC Managed Care – PPO | Admitting: Nurse Practitioner

## 2021-07-11 VITALS — BP 122/82 | HR 78 | Ht 64.0 in | Wt 229.4 lb

## 2021-07-11 DIAGNOSIS — E119 Type 2 diabetes mellitus without complications: Secondary | ICD-10-CM

## 2021-07-11 DIAGNOSIS — E782 Mixed hyperlipidemia: Secondary | ICD-10-CM | POA: Diagnosis not present

## 2021-07-11 DIAGNOSIS — I10 Essential (primary) hypertension: Secondary | ICD-10-CM

## 2021-07-11 LAB — POCT GLYCOSYLATED HEMOGLOBIN (HGB A1C): HbA1c, POC (controlled diabetic range): 7.4 % — AB (ref 0.0–7.0)

## 2021-07-11 NOTE — Progress Notes (Signed)
Endocrinology Follow Up Note       07/11/2021, 3:29 PM   Subjective:    Patient ID: Monique Foster, female    DOB: 02-05-1976.  Monique Foster is being seen in follow up after being seen in consultation for management of currently uncontrolled symptomatic diabetes requested by  Estanislado Pandy, MD.   Past Medical History:  Diagnosis Date   Asthma    Diabetes mellitus without complication (HCC)    GERD (gastroesophageal reflux disease)     Past Surgical History:  Procedure Laterality Date   CESAREAN SECTION     TUBAL LIGATION      Social History   Socioeconomic History   Marital status: Married    Spouse name: Not on file   Number of children: Not on file   Years of education: Not on file   Highest education level: Not on file  Occupational History   Not on file  Tobacco Use   Smoking status: Former   Smokeless tobacco: Never  Vaping Use   Vaping Use: Never used  Substance and Sexual Activity   Alcohol use: No   Drug use: No   Sexual activity: Not on file  Other Topics Concern   Not on file  Social History Narrative   Not on file   Social Determinants of Health   Financial Resource Strain: Not on file  Food Insecurity: Not on file  Transportation Needs: Not on file  Physical Activity: Not on file  Stress: Not on file  Social Connections: Not on file    Family History  Problem Relation Age of Onset   Diabetes Mother    Osteoporosis Mother    Diabetes Father    Heart attack Father     Outpatient Encounter Medications as of 07/11/2021  Medication Sig   atorvastatin (LIPITOR) 10 MG tablet Take 10 mg by mouth daily.   cetirizine (ZYRTEC) 10 MG tablet Take 10 mg by mouth daily.   esomeprazole (NEXIUM) 20 MG capsule Take 20 mg by mouth daily.    glipiZIDE (GLUCOTROL XL) 5 MG 24 hr tablet Take 1 tablet (5 mg total) by mouth daily with breakfast.   losartan (COZAAR) 25 MG tablet Take 25  mg by mouth daily.   metFORMIN (GLUCOPHAGE-XR) 500 MG 24 hr tablet Take 1,000 mg by mouth daily with breakfast.   Multiple Vitamin (MULTIVITAMIN) capsule Take 1 capsule by mouth daily.   OVER THE COUNTER MEDICATION AMbren daily for hot flashes   RYBELSUS 7 MG TABS Take 1 tablet by mouth daily.   [DISCONTINUED] cephALEXin (KEFLEX) 500 MG capsule Take 1 capsule (500 mg total) by mouth 3 (three) times daily.   No facility-administered encounter medications on file as of 07/11/2021.    ALLERGIES: Allergies  Allergen Reactions   Iodine Shortness Of Breath and Rash   Shellfish Allergy Shortness Of Breath, Nausea And Vomiting and Rash   Levaquin [Levofloxacin] Swelling    Facial Area   Latex Itching and Rash   Penicillins Rash    VACCINATION STATUS:  There is no immunization history on file for this patient.  Diabetes She presents for her follow-up diabetic visit. She has type 2 diabetes  mellitus. Onset time: Diagnosed at approx age of 76. Her disease course has been improving. There are no hypoglycemic associated symptoms. Pertinent negatives for diabetes include no fatigue and no weight loss. There are no hypoglycemic complications. Symptoms are improving. There are no diabetic complications. Risk factors for coronary artery disease include diabetes mellitus, dyslipidemia, obesity and sedentary lifestyle. Current diabetic treatment includes oral agent (triple therapy). She is compliant with treatment most of the time. Her weight is fluctuating minimally. She is following a generally healthy diet. When asked about meal planning, she reported none. She has not had a previous visit with a dietitian. She participates in exercise intermittently. (She presents today with no meter and logs from April, she has not checked her glucose recently.  Her POCT A1c today is 7.4%, improving from last visit of 9%.  She reports she continues to work on diet and exercise and she denies any s/s of hypoglycemia.) An  ACE inhibitor/angiotensin II receptor blocker is being taken. She does not see a podiatrist.Eye exam is current.  Hyperlipidemia This is a chronic problem. The current episode started more than 1 year ago. The problem is controlled. Recent lipid tests were reviewed and are normal. Exacerbating diseases include diabetes and obesity. There are no known factors aggravating her hyperlipidemia. Current antihyperlipidemic treatment includes statins. The current treatment provides mild improvement of lipids. Compliance problems include adherence to diet and adherence to exercise.  Risk factors for coronary artery disease include diabetes mellitus, dyslipidemia and obesity.  Hypertension This is a chronic problem. The current episode started more than 1 year ago. The problem is unchanged. The problem is controlled. There are no associated agents to hypertension. Risk factors for coronary artery disease include diabetes mellitus, dyslipidemia and obesity. Past treatments include nothing. Compliance problems include diet and exercise.  Identifiable causes of hypertension include sleep apnea.    Review of systems  Constitutional: + Minimally fluctuating body weight,  current Body mass index is 39.38 kg/m. , no fatigue, no subjective hyperthermia, no subjective hypothermia Eyes: no blurry vision, no xerophthalmia ENT: no sore throat, no nodules palpated in throat, no dysphagia/odynophagia, no hoarseness Cardiovascular: no chest pain, no shortness of breath, no palpitations, no leg swelling Respiratory: no cough, no shortness of breath Gastrointestinal: no nausea/vomiting/diarrhea Musculoskeletal: no muscle/joint aches Skin: no rashes, no hyperemia Neurological: no tremors, no numbness, no tingling, no dizziness Psychiatric: no depression, no anxiety   Objective:     BP 122/82   Pulse 78   Ht 5\' 4"  (1.626 m)   Wt 229 lb 6.4 oz (104.1 kg)   BMI 39.38 kg/m   Wt Readings from Last 3 Encounters:   07/11/21 229 lb 6.4 oz (104.1 kg)  05/15/21 222 lb (100.7 kg)  04/04/21 222 lb 9.6 oz (101 kg)     BP Readings from Last 3 Encounters:  07/11/21 122/82  05/19/21 122/88  05/15/21 129/90      Physical Exam- Limited  Constitutional:  Body mass index is 39.38 kg/m. , not in acute distress, normal state of mind Eyes:  EOMI, no exophthalmos Neck: Supple Cardiovascular: RRR, no murmurs, rubs, or gallops, no edema Respiratory: Adequate breathing efforts, no crackles, rales, rhonchi, or wheezing Musculoskeletal: no gross deformities, strength intact in all four extremities, no gross restriction of joint movements Skin:  no rashes, no hyperemia Neurological: no tremor with outstretched hands    CMP ( most recent) CMP     Component Value Date/Time   NA 135 04/15/2012 0247   K  3.7 04/15/2012 0247   CL 97 04/15/2012 0247   CO2 28 04/15/2012 0247   GLUCOSE 141 (H) 04/15/2012 0247   BUN 12 08/17/2020 0000   CREATININE 0.6 08/17/2020 0000   CREATININE 0.67 04/15/2012 0247   CALCIUM 9.1 04/15/2012 0247   PROT 7.3 04/15/2012 0247   ALBUMIN 3.5 04/15/2012 0247   AST 16 04/15/2012 0247   ALT 18 04/15/2012 0247   ALKPHOS 56 04/15/2012 0247   BILITOT 0.7 04/15/2012 0247   GFRNONAA 116 08/17/2020 0000   GFRAA >90 04/15/2012 0247     Diabetic Labs (most recent): Lab Results  Component Value Date   HGBA1C 7.4 (A) 07/11/2021   HGBA1C 9.0 03/08/2021   HGBA1C 8.2 08/17/2020     Lipid Panel ( most recent) Lipid Panel  No results found for: CHOL, TRIG, HDL, CHOLHDL, VLDL, LDLCALC, LDLDIRECT, LABVLDL    No results found for: TSH, FREET4         Assessment & Plan:   1) Type 2 diabetes mellitus without complication, without long-term current use of insulin (HCC)  She presents today with no meter and logs from April, she has not checked her glucose recently.  Her POCT A1c today is 7.4%, improving from last visit of 9%.  She reports she continues to work on diet and exercise  and she denies any s/s of hypoglycemia.  - Monique HollingsheadAngel L Few has currently uncontrolled symptomatic type 2 DM since 45 years of age.  -Recent labs reviewed.  - I had a long discussion with her about the progressive nature of diabetes and the pathology behind its complications. -her diabetes is not currently complicated but she remains at a high risk for more acute and chronic complications which include CAD, CVA, CKD, retinopathy, and neuropathy. These are all discussed in detail with her.  - Nutritional counseling repeated at each appointment due to patients tendency to fall back in to old habits.  - The patient admits there is a room for improvement in their diet and drink choices. -  Suggestion is made for the patient to avoid simple carbohydrates from their diet including Cakes, Sweet Desserts / Pastries, Ice Cream, Soda (diet and regular), Sweet Tea, Candies, Chips, Cookies, Sweet Pastries, Store Bought Juices, Alcohol in Excess of 1-2 drinks a day, Artificial Sweeteners, Coffee Creamer, and "Sugar-free" Products. This will help patient to have stable blood glucose profile and potentially avoid unintended weight gain.   - I encouraged the patient to switch to unprocessed or minimally processed complex starch and increased protein intake (animal or plant source), fruits, and vegetables.   - Patient is advised to stick to a routine mealtimes to eat 3 meals a day and avoid unnecessary snacks (to snack only to correct hypoglycemia).  - she will be scheduled with Norm SaltPenny Crumpton, RDN, CDE for diabetes education.  - I have approached her with the following individualized plan to manage  her diabetes and patient agrees:    -Based on her improved and stable glycemic profile, she is advised to continue Rybelsus 7 mg po daily (may consider increasing at next visit), therapeutically suitable for patient and continue Metformin 1000 mg ER daily with breakfast and Glipizide 5 mg XL daily with  breakfast.  -She is encouraged to continue monitoring blood glucose at least once daily, before breakfast and to call the clinic if she has readings less than 70 or greater than 300 for 3 tests in a row.    - Specific targets for  A1c;  LDL,  HDL,  and Triglycerides were discussed with the patient.  2) Blood Pressure /Hypertension:  her blood pressure is controlled to target.  She is advised to continue Losartan 25 mg po daily.  3) Lipids/Hyperlipidemia:    There is no recent lipid panel available to review.  She is advised to continue Lipitor 10 mg po daily at bedtime.  Side effects and precautions discussed with her.  Will check lipid panel prior to next visit.  4)  Weight/Diet:  her Body mass index is 39.38 kg/m.  -  clearly complicating her diabetes care.   she is a candidate for weight loss. I discussed with her the fact that loss of 5 - 10% of her  current body weight will have the most impact on her diabetes management.  Exercise, and detailed carbohydrates information provided  -  detailed on discharge instructions.  5) Chronic Care/Health Maintenance: -she is on Statin medications and is encouraged to initiate and continue to follow up with Ophthalmology, Dentist, Podiatrist at least yearly or according to recommendations, and advised to stay away from smoking. I have recommended yearly flu vaccine and pneumonia vaccine at least every 5 years; moderate intensity exercise for up to 150 minutes weekly; and sleep for at least 7 hours a day.  - she is advised to maintain close follow up with Sasser, Clarene Critchley, MD for primary care needs, as well as her other providers for optimal and coordinated care.      I spent 25 minutes in the care of the patient today including review of labs from CMP, Lipids, Thyroid Function, Hematology (current and previous including abstractions from other facilities); face-to-face time discussing  her blood glucose readings/logs, discussing hypoglycemia and  hyperglycemia episodes and symptoms, medications doses, her options of short and long term treatment based on the latest standards of care / guidelines;  discussion about incorporating lifestyle medicine;  and documenting the encounter.    Please refer to Patient Instructions for Blood Glucose Monitoring and Insulin/Medications Dosing Guide"  in media tab for additional information. Please  also refer to " Patient Self Inventory" in the Media  tab for reviewed elements of pertinent patient history.  Monique Foster participated in the discussions, expressed understanding, and voiced agreement with the above plans.  All questions were answered to her satisfaction. she is encouraged to contact clinic should she have any questions or concerns prior to her return visit.  Follow up plan: - Return in about 3 months (around 10/11/2021) for Diabetes F/U- A1c and UM in office, Previsit labs, Bring meter and logs.  Ronny Bacon, [facility] [address] [place], [place] [address] Phone: [phone] Fax: [phone]  07/11/2021, 3:29 PM

## 2021-07-14 DIAGNOSIS — J45909 Unspecified asthma, uncomplicated: Secondary | ICD-10-CM | POA: Insufficient documentation

## 2021-07-31 DIAGNOSIS — Z6841 Body Mass Index (BMI) 40.0 and over, adult: Secondary | ICD-10-CM | POA: Diagnosis not present

## 2021-07-31 DIAGNOSIS — N3090 Cystitis, unspecified without hematuria: Secondary | ICD-10-CM | POA: Diagnosis not present

## 2021-09-19 DIAGNOSIS — E782 Mixed hyperlipidemia: Secondary | ICD-10-CM | POA: Diagnosis not present

## 2021-09-19 DIAGNOSIS — K219 Gastro-esophageal reflux disease without esophagitis: Secondary | ICD-10-CM | POA: Diagnosis not present

## 2021-09-25 ENCOUNTER — Other Ambulatory Visit: Payer: Self-pay

## 2021-09-25 ENCOUNTER — Encounter: Payer: Self-pay | Admitting: Podiatry

## 2021-09-25 ENCOUNTER — Ambulatory Visit (INDEPENDENT_AMBULATORY_CARE_PROVIDER_SITE_OTHER): Payer: BC Managed Care – PPO | Admitting: Podiatry

## 2021-09-25 DIAGNOSIS — L03031 Cellulitis of right toe: Secondary | ICD-10-CM

## 2021-09-25 DIAGNOSIS — G4733 Obstructive sleep apnea (adult) (pediatric): Secondary | ICD-10-CM | POA: Diagnosis not present

## 2021-09-25 DIAGNOSIS — E1165 Type 2 diabetes mellitus with hyperglycemia: Secondary | ICD-10-CM | POA: Diagnosis not present

## 2021-09-25 DIAGNOSIS — E7849 Other hyperlipidemia: Secondary | ICD-10-CM | POA: Diagnosis not present

## 2021-09-25 MED ORDER — CEPHALEXIN 500 MG PO CAPS: 500.0000 mg | ORAL_CAPSULE | Freq: Four times a day (QID) | ORAL | 0 refills | Status: DC

## 2021-09-25 NOTE — Patient Instructions (Signed)

## 2021-09-25 NOTE — Progress Notes (Signed)
  Subjective:  Patient ID: Monique Foster, female    DOB: 04/19/1976,   MRN: [id number]  Chief Complaint  Patient presents with   Ingrown Toenail    Right great medial ingrown. Pt states she had a total removal 5 months ago. The same toe had become ingrown due to pt hitting her toe on a hard surface. Toe is not ingrown, red, edema with drainage.     45 y.o. female presents for right ingrown toenail that has been present for the past two weeks. Has a history of ingrown nails on this toe and had the nail removed in the past. The last time was in May.  Last A1c was 7.4 down from previous 9.. Denies any other pedal complaints. Denies n/v/f/c.   Past Medical History:  Diagnosis Date   Asthma    Diabetes mellitus without complication (HCC)    GERD (gastroesophageal reflux disease)     Objective:  Physical Exam: Vascular: DP/PT pulses 2/4 bilateral. CFT <3 seconds. Normal hair growth on digits. No edema.  Skin. No lacerations or abrasions bilateral feet. Incurvation of bilateral and medial borders of right hallux nail with surrounding edema and erythema as well as purulence noted to the medial border. Erythema noted proximally as well.  Musculoskeletal: MMT 5/5 bilateral lower extremities in DF, PF, Inversion and Eversion. Deceased ROM in DF of ankle joint. Tender to medial and lateral borders of right hallux.  Neurological: Sensation intact to light touch.   Assessment:   1. Paronychia of great toe, right      Plan:  Patient was evaluated and treated and all questions answered. Patient requesting removal of ingrown nail today. Procedure below.  Discussed procedure and post procedure care and patient expressed understanding.  Will follow-up in 2 weeks for nail check or sooner if any problems arise.    Procedure:  Procedure: total Nail Avulsion of right hallux nail Surgeon: Louann Sjogren, DPM  Pre-op Dx: Ingrown toenail with infection Post-op: Same  Place of Surgery: Office exam  room.  Indications for surgery: Painful and ingrown toenail.  Findings: There is redness, warmth, and swelling of tissues with incurvation of nail border. Purulence noted.    The patient is requesting removal of nail with chemical matrixectomy. Risks and complications were discussed with the patient for which they understand and written consent was obtained. Under sterile conditions a total of 3 mL of 1:1 mixture 0.5% marcaine plain and 1% lidocaine plain was infiltrated in a hallux block fashion. Once anesthetized, the skin was prepped in sterile fashion. A tourniquet was then applied. Next the right  hallux was then removed with hemostate making sure to remove the entire offending nail border.  Next phenol was then applied under standard conditions and copiously irrigated. Silvadene was applied. A dry sterile dressing was applied. After application of the dressing the tourniquet was removed and there is found to be an immediate capillary refill time to the digit. The patient tolerated the procedure well without any complications. Post procedure instructions were discussed the patient for which he verbally understood. Follow-up in two weeks for nail check or sooner if any problems are to arise. Discussed signs/symptoms of infection and directed to call the office immediately should any occur or go directly to the emergency room. In the meantime, encouraged to call the office with any questions, concerns, changes symptoms.   Louann Sjogren, DPM

## 2021-09-28 ENCOUNTER — Telehealth: Payer: Self-pay | Admitting: Podiatry

## 2021-09-28 ENCOUNTER — Other Ambulatory Visit: Payer: Self-pay | Admitting: Podiatry

## 2021-09-28 MED ORDER — CEPHALEXIN 500 MG PO CAPS: 500.0000 mg | ORAL_CAPSULE | Freq: Four times a day (QID) | ORAL | 0 refills | Status: AC

## 2021-09-28 NOTE — Telephone Encounter (Signed)
Patient has been notified. Thank you.

## 2021-09-28 NOTE — Telephone Encounter (Signed)
Patient called office this morning to request clarification on her antibiotic. States she only received 10 pills and was under the impression she was to take for a full week. Please advise. Thank you!

## 2021-10-16 ENCOUNTER — Ambulatory Visit: Payer: BC Managed Care – PPO | Admitting: Podiatry

## 2021-10-16 DIAGNOSIS — E119 Type 2 diabetes mellitus without complications: Secondary | ICD-10-CM | POA: Diagnosis not present

## 2021-10-17 LAB — COMPREHENSIVE METABOLIC PANEL
ALT: 52 IU/L — ABNORMAL HIGH (ref 0–32)
AST: 50 IU/L — ABNORMAL HIGH (ref 0–40)
Albumin/Globulin Ratio: 1.5 (ref 1.2–2.2)
Albumin: 4.4 g/dL (ref 3.8–4.8)
Alkaline Phosphatase: 80 IU/L (ref 44–121)
BUN/Creatinine Ratio: 15 (ref 9–23)
BUN: 10 mg/dL (ref 6–24)
Bilirubin Total: 0.6 mg/dL (ref 0.0–1.2)
CO2: 23 mmol/L (ref 20–29)
Calcium: 9.5 mg/dL (ref 8.7–10.2)
Chloride: 96 mmol/L (ref 96–106)
Creatinine, Ser: 0.67 mg/dL (ref 0.57–1.00)
Globulin, Total: 2.9 g/dL (ref 1.5–4.5)
Glucose: 277 mg/dL — ABNORMAL HIGH (ref 70–99)
Potassium: 4.3 mmol/L (ref 3.5–5.2)
Sodium: 136 mmol/L (ref 134–144)
Total Protein: 7.3 g/dL (ref 6.0–8.5)
eGFR: 111 mL/min/{1.73_m2} (ref >59)

## 2021-10-17 LAB — TSH: TSH: 1.33 u[IU]/mL (ref 0.450–4.500)

## 2021-10-17 LAB — LIPID PANEL
Chol/HDL Ratio: 5.6 ratio — ABNORMAL HIGH (ref 0.0–4.4)
Cholesterol, Total: 186 mg/dL (ref 100–199)
HDL: 33 mg/dL — ABNORMAL LOW
LDL Chol Calc (NIH): 62 mg/dL (ref 0–99)
Triglycerides: 597 mg/dL (ref 0–149)
VLDL Cholesterol Cal: 91 mg/dL — ABNORMAL HIGH (ref 5–40)

## 2021-10-17 LAB — T4, FREE: Free T4: 1.17 ng/dL (ref 0.82–1.77)

## 2021-10-18 ENCOUNTER — Ambulatory Visit (INDEPENDENT_AMBULATORY_CARE_PROVIDER_SITE_OTHER): Payer: BC Managed Care – PPO | Admitting: Nurse Practitioner

## 2021-10-18 ENCOUNTER — Encounter: Payer: Self-pay | Admitting: Nurse Practitioner

## 2021-10-18 ENCOUNTER — Other Ambulatory Visit: Payer: Self-pay

## 2021-10-18 VITALS — BP 116/75 | HR 80 | Ht 64.0 in | Wt 228.0 lb

## 2021-10-18 DIAGNOSIS — E119 Type 2 diabetes mellitus without complications: Secondary | ICD-10-CM

## 2021-10-18 DIAGNOSIS — I10 Essential (primary) hypertension: Secondary | ICD-10-CM

## 2021-10-18 DIAGNOSIS — E782 Mixed hyperlipidemia: Secondary | ICD-10-CM

## 2021-10-18 LAB — POCT GLYCOSYLATED HEMOGLOBIN (HGB A1C): HbA1c, POC (controlled diabetic range): 8.5 % — AB (ref 0.0–7.0)

## 2021-10-18 LAB — POCT UA - MICROALBUMIN
Albumin/Creatinine Ratio, Urine, POC: <30
Creatinine, POC: 200 mg/dL
Microalbumin Ur, POC: 30 mg/L

## 2021-10-18 MED ORDER — TOUJEO MAX SOLOSTAR 300 UNIT/ML ~~LOC~~ SOPN: 10.0000 [IU] | PEN_INJECTOR | Freq: Every evening | SUBCUTANEOUS | 0 refills | Status: DC

## 2021-10-18 MED ORDER — PEN NEEDLES 31G X 5 MM MISC: 11 refills | Status: AC

## 2021-10-18 NOTE — Progress Notes (Signed)
Endocrinology Follow Up Note       10/18/2021, 3:55 PM   Subjective:    Patient ID: Monique Foster, female    DOB: [birth year].  Monique Foster is being seen in follow up after being seen in consultation for management of currently uncontrolled symptomatic diabetes requested by  Estanislado Pandy, MD.   Past Medical History:  Diagnosis Date   Asthma    Diabetes mellitus without complication (HCC)    GERD (gastroesophageal reflux disease)     Past Surgical History:  Procedure Laterality Date   CESAREAN SECTION     TUBAL LIGATION      Social History   Socioeconomic History   Marital status: Married    Spouse name: Not on file   Number of children: Not on file   Years of education: Not on file   Highest education level: Not on file  Occupational History   Not on file  Tobacco Use   Smoking status: Former   Smokeless tobacco: Never  Vaping Use   Vaping Use: Never used  Substance and Sexual Activity   Alcohol use: No   Drug use: No   Sexual activity: Not on file  Other Topics Concern   Not on file  Social History Narrative   Not on file   Social Determinants of Health   Financial Resource Strain: Not on file  Food Insecurity: Not on file  Transportation Needs: Not on file  Physical Activity: Not on file  Stress: Not on file  Social Connections: Not on file    Family History  Problem Relation Age of Onset   Diabetes Mother    Osteoporosis Mother    Diabetes Father    Heart attack Father     Outpatient Encounter Medications as of 10/18/2021  Medication Sig   atorvastatin (LIPITOR) 10 MG tablet Take 10 mg by mouth daily.   cetirizine (ZYRTEC) 10 MG tablet Take 10 mg by mouth daily.   esomeprazole (NEXIUM) 20 MG capsule Take 20 mg by mouth daily.    glipiZIDE (GLUCOTROL XL) 5 MG 24 hr tablet Take 1 tablet (5 mg total) by mouth daily with breakfast.   insulin glargine, 2 Unit Dial, (TOUJEO  MAX SOLOSTAR) 300 UNIT/ML Solostar Pen Inject 10 Units into the skin at bedtime.   Insulin Pen Needle (PEN NEEDLES) 31G X 5 MM MISC Use to inject insulin once daily   losartan (COZAAR) 25 MG tablet Take 25 mg by mouth daily.   metFORMIN (GLUCOPHAGE-XR) 500 MG 24 hr tablet Take 1,000 mg by mouth daily with breakfast.   Multiple Vitamin (MULTIVITAMIN) capsule Take 1 capsule by mouth daily.   OVER THE COUNTER MEDICATION AMbren daily for hot flashes   RYBELSUS 7 MG TABS Take 1 tablet by mouth daily.   No facility-administered encounter medications on file as of 10/18/2021.    ALLERGIES: Allergies  Allergen Reactions   Iodine Shortness Of Breath and Rash   Levofloxacin Swelling    Facial Area Facial Area   Shellfish Allergy Shortness Of Breath, Nausea And Vomiting and Rash   Shellfish-Derived Products Nausea And Vomiting, Rash and Shortness Of Breath   Latex Itching and Rash  Penicillins Rash    VACCINATION STATUS:  There is no immunization history on file for this patient.  Diabetes She presents for her follow-up diabetic visit. She has type 2 diabetes mellitus. Onset time: Diagnosed at approx age of 74. Her disease course has been worsening. There are no hypoglycemic associated symptoms. Pertinent negatives for diabetes include no fatigue and no weight loss. There are no hypoglycemic complications. Symptoms are stable. There are no diabetic complications. Risk factors for coronary artery disease include diabetes mellitus, dyslipidemia, obesity and sedentary lifestyle. Current diabetic treatment includes oral agent (triple therapy). She is compliant with treatment most of the time. Her weight is fluctuating minimally. She is following a generally unhealthy diet. When asked about meal planning, she reported none. She has not had a previous visit with a dietitian. She participates in exercise intermittently. Her home blood glucose trend is increasing steadily. Her breakfast blood glucose  range is generally >200 mg/dl. Her bedtime blood glucose range is generally >200 mg/dl. Her overall blood glucose range is >200 mg/dl. (She presents today with her logs, no meter, showing above target fasting and postprandial glycemic profile.  Her POCT A1c today is 8.5%, increasing from last visit of 7.4%.  She had COVID earlier this month and just got back from a cruise and admits she did not eat as healthy as she should at that time.) An ACE inhibitor/angiotensin II receptor blocker is being taken. She does not see a podiatrist.Eye exam is current.  Hyperlipidemia This is a chronic problem. The current episode started more than 1 year ago. The problem is controlled. Recent lipid tests were reviewed and are variable. Exacerbating diseases include diabetes and obesity. Factors aggravating her hyperlipidemia include fatty foods. Current antihyperlipidemic treatment includes statins. The current treatment provides mild improvement of lipids. Compliance problems include adherence to diet and adherence to exercise.  Risk factors for coronary artery disease include diabetes mellitus, dyslipidemia and obesity.  Hypertension This is a chronic problem. The current episode started more than 1 year ago. The problem is unchanged. The problem is controlled. There are no associated agents to hypertension. Risk factors for coronary artery disease include diabetes mellitus, dyslipidemia and obesity. Past treatments include nothing. Compliance problems include diet and exercise.  Identifiable causes of hypertension include sleep apnea.    Review of systems  Constitutional: + Minimally fluctuating body weight,  current Body mass index is 39.14 kg/m. , no fatigue, no subjective hyperthermia, no subjective hypothermia Eyes: no blurry vision, no xerophthalmia ENT: no sore throat, no nodules palpated in throat, no dysphagia/odynophagia, no hoarseness Cardiovascular: no chest pain, no shortness of breath, no palpitations,  no leg swelling Respiratory: no cough, no shortness of breath Gastrointestinal: no nausea/vomiting/diarrhea Musculoskeletal: no muscle/joint aches Skin: no rashes, no hyperemia Neurological: no tremors, no numbness, no tingling, no dizziness Psychiatric: no depression, no anxiety   Objective:     BP 116/75   Pulse 80   Ht 5\' 4"  (1.626 m)   Wt 228 lb (103.4 kg)   BMI 39.14 kg/m   Wt Readings from Last 3 Encounters:  10/18/21 228 lb (103.4 kg)  07/11/21 229 lb 6.4 oz (104.1 kg)  05/15/21 222 lb (100.7 kg)     BP Readings from Last 3 Encounters:  10/18/21 116/75  07/11/21 122/82  05/19/21 122/88      Physical Exam- Limited  Constitutional:  Body mass index is 39.14 kg/m. , not in acute distress, normal state of mind Eyes:  EOMI, no exophthalmos Neck: Supple Cardiovascular:  RRR, no murmurs, rubs, or gallops, no edema Respiratory: Adequate breathing efforts, no crackles, rales, rhonchi, or wheezing Musculoskeletal: no gross deformities, strength intact in all four extremities, no gross restriction of joint movements Skin:  no rashes, no hyperemia Neurological: no tremor with outstretched hands    CMP ( most recent) CMP     Component Value Date/Time   NA 136 10/16/2021 0845   K 4.3 10/16/2021 0845   CL 96 10/16/2021 0845   CO2 23 10/16/2021 0845   GLUCOSE 277 (H) 10/16/2021 0845   GLUCOSE 141 (H) 04/15/2012 0247   BUN 10 10/16/2021 0845   CREATININE 0.67 10/16/2021 0845   CALCIUM 9.5 10/16/2021 0845   PROT 7.3 10/16/2021 0845   ALBUMIN 4.4 10/16/2021 0845   AST 50 (H) 10/16/2021 0845   ALT 52 (H) 10/16/2021 0845   ALKPHOS 80 10/16/2021 0845   BILITOT 0.6 10/16/2021 0845   GFRNONAA 116 08/17/2020 0000   GFRAA >90 04/15/2012 0247     Diabetic Labs (most recent): Lab Results  Component Value Date   HGBA1C 8.5 (A) 10/18/2021   HGBA1C 7.4 (A) 07/11/2021   HGBA1C 9.0 03/08/2021     Lipid Panel ( most recent) Lipid Panel     Component Value Date/Time    CHOL 186 10/16/2021 0845   TRIG 597 (HH) 10/16/2021 0845   HDL 33 (L) 10/16/2021 0845   CHOLHDL 5.6 (H) 10/16/2021 0845   LDLCALC 62 10/16/2021 0845   LABVLDL 91 (H) 10/16/2021 0845      Lab Results  Component Value Date   TSH 1.330 10/16/2021   FREET4 1.17 10/16/2021           Assessment & Plan:   1) Type 2 diabetes mellitus without complication, without long-term current use of insulin (HCC)  She presents today with her logs, no meter, showing above target fasting and postprandial glycemic profile.  Her POCT A1c today is 8.5%, increasing from last visit of 7.4%.  She had COVID earlier this month and just got back from a cruise and admits she did not eat as healthy as she should at that time.  - Monique Foster has currently uncontrolled symptomatic type 2 DM since 45 years of age.  -Recent labs reviewed.  She has elevated LFTs, and triglycerides, may be due to fatty liver?  PCP following along for this.  - I had a long discussion with her about the progressive nature of diabetes and the pathology behind its complications. -her diabetes is not currently complicated but she remains at a high risk for more acute and chronic complications which include CAD, CVA, CKD, retinopathy, and neuropathy. These are all discussed in detail with her.  - Nutritional counseling repeated at each appointment due to patients tendency to fall back in to old habits.  - The patient admits there is a room for improvement in their diet and drink choices. -  Suggestion is made for the patient to avoid simple carbohydrates from their diet including Cakes, Sweet Desserts / Pastries, Ice Cream, Soda (diet and regular), Sweet Tea, Candies, Chips, Cookies, Sweet Pastries, Store Bought Juices, Alcohol in Excess of 1-2 drinks a day, Artificial Sweeteners, Coffee Creamer, and "Sugar-free" Products. This will help patient to have stable blood glucose profile and potentially avoid unintended weight gain.   - I  encouraged the patient to switch to unprocessed or minimally processed complex starch and increased protein intake (animal or plant source), fruits, and vegetables.   - Patient is advised to  stick to a routine mealtimes to eat 3 meals a day and avoid unnecessary snacks (to snack only to correct hypoglycemia).  - she will be scheduled with Norm Salt, RDN, CDE for diabetes education.  - I have approached her with the following individualized plan to manage  her diabetes and patient agrees:   -Given her recent loss of control, she is approached to start insulin treatment and she agrees.  I discussed and initiated Toujeo 10 units SQ nightly (sample of Tresiba given today) to be taken in addition to her Rybelsus 7 mg po daily (cannot tolerate higher dose due to elevated triglycerides), Glipizide 5 mg XL daily with breakfast and Metformin 1000 mg ER daily with breakfast.    We also discussed proper injection technique and sites.  -She is encouraged to continue monitoring blood glucose at least twice daily, before breakfast and before bed, and to call the clinic if she has readings less than 70 or greater than 300 for 3 tests in a row.    - Specific targets for  A1c;  LDL, HDL,  and Triglycerides were discussed with the patient.  2) Blood Pressure /Hypertension:  her blood pressure is controlled to target.  She is advised to continue Losartan 25 mg po daily.  3) Lipids/Hyperlipidemia:    Her most recent lipid panel from 10/16/21 shows controlled LDL of 62 and elevated triglycerides of 597.  She is advised to continue Lipitor 10 mg po daily at bedtime.  Side effects and precautions discussed with her.  She is advised to avoid fried foods and butter.    4)  Weight/Diet:  her Body mass index is 39.14 kg/m.  -  clearly complicating her diabetes care.   she is a candidate for weight loss. I discussed with her the fact that loss of 5 - 10% of her  current body weight will have the most impact on her  diabetes management.  Exercise, and detailed carbohydrates information provided  -  detailed on discharge instructions.  5) Chronic Care/Health Maintenance: -she is on Statin medications and is encouraged to initiate and continue to follow up with Ophthalmology, Dentist, Podiatrist at least yearly or according to recommendations, and advised to stay away from smoking. I have recommended yearly flu vaccine and pneumonia vaccine at least every 5 years; moderate intensity exercise for up to 150 minutes weekly; and sleep for at least 7 hours a day.  - she is advised to maintain close follow up with Sasser, Clarene Critchley, MD for primary care needs, as well as her other providers for optimal and coordinated care.      I spent 45 minutes in the care of the patient today including review of labs from CMP, Lipids, Thyroid Function, Hematology (current and previous including abstractions from other facilities); face-to-face time discussing  her blood glucose readings/logs, discussing hypoglycemia and hyperglycemia episodes and symptoms, medications doses, her options of short and long term treatment based on the latest standards of care / guidelines;  discussion about incorporating lifestyle medicine;  and documenting the encounter.    Please refer to Patient Instructions for Blood Glucose Monitoring and Insulin/Medications Dosing Guide"  in media tab for additional information. Please  also refer to " Patient Self Inventory" in the Media  tab for reviewed elements of pertinent patient history.  Monique Foster participated in the discussions, expressed understanding, and voiced agreement with the above plans.  All questions were answered to her satisfaction. she is encouraged to contact clinic should she  have any questions or concerns prior to her return visit.    Follow up plan: - Return in about 3 months (around 01/18/2022) for Diabetes F/U with A1c in office, Bring meter and logs, No previsit  labs.    Ronny Bacon, [facility] [address] [place], [place] [address] Phone: [phone] Fax: [phone]  10/18/2021, 3:55 PM

## 2021-10-18 NOTE — Patient Instructions (Signed)

## 2021-10-25 ENCOUNTER — Encounter: Payer: Self-pay | Admitting: Nurse Practitioner

## 2021-12-11 ENCOUNTER — Other Ambulatory Visit: Payer: Self-pay

## 2021-12-11 DIAGNOSIS — E119 Type 2 diabetes mellitus without complications: Secondary | ICD-10-CM

## 2021-12-11 MED ORDER — RYBELSUS 7 MG PO TABS: 1.0000 | ORAL_TABLET | Freq: Every day | ORAL | 0 refills | Status: DC

## 2021-12-11 MED ORDER — METFORMIN HCL ER 500 MG PO TB24: 1000.0000 mg | ORAL_TABLET | Freq: Every day | ORAL | 0 refills | Status: DC

## 2022-01-04 ENCOUNTER — Encounter: Payer: Self-pay | Admitting: Nurse Practitioner

## 2022-01-05 ENCOUNTER — Other Ambulatory Visit: Payer: Self-pay | Admitting: Nurse Practitioner

## 2022-01-05 MED ORDER — DEXCOM G6 TRANSMITTER MISC: 3 refills | Status: DC

## 2022-01-05 MED ORDER — DEXCOM G6 RECEIVER DEVI: 0 refills | Status: DC

## 2022-01-05 MED ORDER — DEXCOM G6 SENSOR MISC: 3 refills | Status: DC

## 2022-01-18 ENCOUNTER — Encounter: Payer: Self-pay | Admitting: Nurse Practitioner

## 2022-01-18 ENCOUNTER — Ambulatory Visit (INDEPENDENT_AMBULATORY_CARE_PROVIDER_SITE_OTHER): Payer: BC Managed Care – PPO | Admitting: Nurse Practitioner

## 2022-01-18 ENCOUNTER — Other Ambulatory Visit: Payer: Self-pay

## 2022-01-18 VITALS — BP 112/75 | HR 75 | Ht 64.0 in | Wt 231.2 lb

## 2022-01-18 DIAGNOSIS — I10 Essential (primary) hypertension: Secondary | ICD-10-CM

## 2022-01-18 DIAGNOSIS — E119 Type 2 diabetes mellitus without complications: Secondary | ICD-10-CM

## 2022-01-18 DIAGNOSIS — E782 Mixed hyperlipidemia: Secondary | ICD-10-CM

## 2022-01-18 LAB — POCT GLYCOSYLATED HEMOGLOBIN (HGB A1C): HbA1c, POC (controlled diabetic range): 8.6 % — AB (ref 0.0–7.0)

## 2022-01-18 MED ORDER — TOUJEO MAX SOLOSTAR 300 UNIT/ML ~~LOC~~ SOPN: 40.0000 [IU] | PEN_INJECTOR | Freq: Every evening | SUBCUTANEOUS | 3 refills | Status: DC

## 2022-01-18 NOTE — Progress Notes (Signed)
Endocrinology Follow Up Note       01/18/2022, 7:33 PM   Subjective:    Patient ID: Monique Foster, female    DOB: 02-04-1976.  Monique Foster is being seen in follow up after being seen in consultation for management of currently uncontrolled symptomatic diabetes requested by  Estanislado Pandy, MD.   Past Medical History:  Diagnosis Date   Asthma    Diabetes mellitus without complication (HCC)    GERD (gastroesophageal reflux disease)     Past Surgical History:  Procedure Laterality Date   CESAREAN SECTION     TUBAL LIGATION      Social History   Socioeconomic History   Marital status: Married    Spouse name: Not on file   Number of children: Not on file   Years of education: Not on file   Highest education level: Not on file  Occupational History   Not on file  Tobacco Use   Smoking status: Former   Smokeless tobacco: Never  Vaping Use   Vaping Use: Never used  Substance and Sexual Activity   Alcohol use: No   Drug use: No   Sexual activity: Not on file  Other Topics Concern   Not on file  Social History Narrative   Not on file   Social Determinants of Health   Financial Resource Strain: Not on file  Food Insecurity: Not on file  Transportation Needs: Not on file  Physical Activity: Not on file  Stress: Not on file  Social Connections: Not on file    Family History  Problem Relation Age of Onset   Diabetes Mother    Osteoporosis Mother    Diabetes Father    Heart attack Father     Outpatient Encounter Medications as of 01/18/2022  Medication Sig   atorvastatin (LIPITOR) 10 MG tablet Take 10 mg by mouth daily.   cetirizine (ZYRTEC) 10 MG tablet Take 10 mg by mouth daily.   esomeprazole (NEXIUM) 20 MG capsule Take 20 mg by mouth daily.    glipiZIDE (GLUCOTROL XL) 5 MG 24 hr tablet Take 1 tablet (5 mg total) by mouth daily with breakfast.   Insulin Pen Needle (PEN NEEDLES) 31G X 5  MM MISC Use to inject insulin once daily   losartan (COZAAR) 25 MG tablet Take 25 mg by mouth daily.   metFORMIN (GLUCOPHAGE-XR) 500 MG 24 hr tablet Take 2 tablets (1,000 mg total) by mouth daily with breakfast.   Multiple Vitamin (MULTIVITAMIN) capsule Take 1 capsule by mouth daily.   OVER THE COUNTER MEDICATION AMbren daily for hot flashes   RELION PEN NEEDLES 32G X 4 MM MISC SMARTSIG:Injection Daily   RYBELSUS 7 MG TABS Take 1 tablet by mouth daily.   [DISCONTINUED] insulin glargine, 2 Unit Dial, (TOUJEO MAX SOLOSTAR) 300 UNIT/ML Solostar Pen Inject 10 Units into the skin at bedtime.   insulin glargine, 2 Unit Dial, (TOUJEO MAX SOLOSTAR) 300 UNIT/ML Solostar Pen Inject 40 Units into the skin at bedtime.   [DISCONTINUED] Continuous Blood Gluc Receiver (DEXCOM G6 RECEIVER) DEVI Use to monitor glucose 4 times daily (Patient not taking: Reported on 01/18/2022)   [DISCONTINUED] Continuous Blood Gluc Sensor (  DEXCOM G6 SENSOR) MISC Change sensor every 10 days as directed (Patient not taking: Reported on 01/18/2022)   [DISCONTINUED] Continuous Blood Gluc Transmit (DEXCOM G6 TRANSMITTER) MISC Change transmitter every 90 days as directed. (Patient not taking: Reported on 01/18/2022)   No facility-administered encounter medications on file as of 01/18/2022.    ALLERGIES: Allergies  Allergen Reactions   Iodine Shortness Of Breath and Rash   Levofloxacin Swelling    Facial Area Facial Area   Shellfish Allergy Shortness Of Breath, Nausea And Vomiting and Rash   Shellfish-Derived Products Nausea And Vomiting, Rash and Shortness Of Breath   Latex Itching and Rash   Penicillins Rash    VACCINATION STATUS:  There is no immunization history on file for this patient.  Diabetes She presents for her follow-up diabetic visit. She has type 2 diabetes mellitus. Onset time: Diagnosed at approx age of 46. Her disease course has been stable. There are no hypoglycemic associated symptoms. Pertinent negatives for  diabetes include no fatigue and no weight loss. There are no hypoglycemic complications. Symptoms are stable. There are no diabetic complications. Risk factors for coronary artery disease include diabetes mellitus, dyslipidemia, obesity and sedentary lifestyle. Current diabetic treatment includes oral agent (triple therapy) and insulin injections. She is compliant with treatment most of the time. Her weight is fluctuating minimally. She is following a generally unhealthy diet. When asked about meal planning, she reported none. She has not had a previous visit with a dietitian. She participates in exercise intermittently. Her home blood glucose trend is fluctuating minimally. Her breakfast blood glucose range is generally >200 mg/dl. Her lunch blood glucose range is generally 130-140 mg/dl. Her dinner blood glucose range is generally 180-200 mg/dl. Her bedtime blood glucose range is generally >200 mg/dl. Her overall blood glucose range is >200 mg/dl. (She presents today with her logs, no meter, showing above target fasting and near goal postprandial glucose readings.  Her POCT A1c today is 8.6%, essentially unchanged from last visit of 8.5%.  She admits to late night snacking still when she gets home from work.  Her insurance did not provide optimal coverage for her to have Dexcom.  She denies any hypoglycemia.) An ACE inhibitor/angiotensin II receptor blocker is being taken. She does not see a podiatrist.Eye exam is current.  Hyperlipidemia This is a chronic problem. The current episode started more than 1 year ago. The problem is uncontrolled. Recent lipid tests were reviewed and are variable. Exacerbating diseases include diabetes and obesity. Factors aggravating her hyperlipidemia include fatty foods. Current antihyperlipidemic treatment includes statins. The current treatment provides mild improvement of lipids. Compliance problems include adherence to diet and adherence to exercise.  Risk factors for coronary  artery disease include diabetes mellitus, dyslipidemia, obesity and hypertension.  Hypertension This is a chronic problem. The current episode started more than 1 year ago. The problem is unchanged. The problem is controlled. There are no associated agents to hypertension. Risk factors for coronary artery disease include diabetes mellitus, dyslipidemia and obesity. Past treatments include angiotensin blockers. Compliance problems include diet and exercise.  Identifiable causes of hypertension include sleep apnea.    Review of systems  Constitutional: + steadily increasing body weight,  current Body mass index is 39.69 kg/m. , no fatigue, no subjective hyperthermia, no subjective hypothermia Eyes: no blurry vision, no xerophthalmia ENT: no sore throat, no nodules palpated in throat, no dysphagia/odynophagia, no hoarseness Cardiovascular: no chest pain, no shortness of breath, no palpitations, no leg swelling Respiratory: no cough, no shortness  of breath Gastrointestinal: no nausea/vomiting/diarrhea Musculoskeletal: no muscle/joint aches Skin: no rashes, no hyperemia Neurological: no tremors, no numbness, no tingling, no dizziness Psychiatric: no depression, no anxiety   Objective:     BP 112/75    Pulse 75    Ht 5\' 4"  (1.626 m)    Wt 231 lb 3.2 oz (104.9 kg)    SpO2 96%    BMI 39.69 kg/m   Wt Readings from Last 3 Encounters:  01/18/22 231 lb 3.2 oz (104.9 kg)  10/18/21 228 lb (103.4 kg)  07/11/21 229 lb 6.4 oz (104.1 kg)     BP Readings from Last 3 Encounters:  01/18/22 112/75  10/18/21 116/75  07/11/21 122/82      Physical Exam- Limited  Constitutional:  Body mass index is 39.69 kg/m. , not in acute distress, normal state of mind Eyes:  EOMI, no exophthalmos Neck: Supple Cardiovascular: RRR, no murmurs, rubs, or gallops, no edema Respiratory: Adequate breathing efforts, no crackles, rales, rhonchi, or wheezing Musculoskeletal: no gross deformities, strength intact in all  four extremities, no gross restriction of joint movements Skin:  no rashes, no hyperemia Neurological: no tremor with outstretched hands    CMP ( most recent) CMP     Component Value Date/Time   NA 136 10/16/2021 0845   K 4.3 10/16/2021 0845   CL 96 10/16/2021 0845   CO2 23 10/16/2021 0845   GLUCOSE 277 (H) 10/16/2021 0845   GLUCOSE 141 (H) 04/15/2012 0247   BUN 10 10/16/2021 0845   CREATININE 0.67 10/16/2021 0845   CALCIUM 9.5 10/16/2021 0845   PROT 7.3 10/16/2021 0845   ALBUMIN 4.4 10/16/2021 0845   AST 50 (H) 10/16/2021 0845   ALT 52 (H) 10/16/2021 0845   ALKPHOS 80 10/16/2021 0845   BILITOT 0.6 10/16/2021 0845   GFRNONAA 116 08/17/2020 0000   GFRAA >90 04/15/2012 0247     Diabetic Labs (most recent): Lab Results  Component Value Date   HGBA1C 8.6 (A) 01/18/2022   HGBA1C 8.5 (A) 10/18/2021   HGBA1C 7.4 (A) 07/11/2021     Lipid Panel ( most recent) Lipid Panel     Component Value Date/Time   CHOL 186 10/16/2021 0845   TRIG 597 (HH) 10/16/2021 0845   HDL 33 (L) 10/16/2021 0845   CHOLHDL 5.6 (H) 10/16/2021 0845   LDLCALC 62 10/16/2021 0845   LABVLDL 91 (H) 10/16/2021 0845      Lab Results  Component Value Date   TSH 1.330 10/16/2021   FREET4 1.17 10/16/2021           Assessment & Plan:   1) Type 2 diabetes mellitus without complication, without long-term current use of insulin (HCC)  She presents today with her logs, no meter, showing above target fasting and near goal postprandial glucose readings.  Her POCT A1c today is 8.6%, essentially unchanged from last visit of 8.5%.  She admits to late night snacking still when she gets home from work.  Her insurance did not provide optimal coverage for her to have Dexcom.  She denies any hypoglycemia.  - 10/18/2021 has currently uncontrolled symptomatic type 2 DM since 46 years of age.  -Recent labs reviewed.  She has elevated LFTs, and triglycerides, may be due to fatty liver?  PCP following along for  this.  - I had a long discussion with her about the progressive nature of diabetes and the pathology behind its complications. -her diabetes is not currently complicated but she remains at a  high risk for more acute and chronic complications which include CAD, CVA, CKD, retinopathy, and neuropathy. These are all discussed in detail with her.  - Nutritional counseling repeated at each appointment due to patients tendency to fall back in to old habits.  - The patient admits there is a room for improvement in their diet and drink choices. -  Suggestion is made for the patient to avoid simple carbohydrates from their diet including Cakes, Sweet Desserts / Pastries, Ice Cream, Soda (diet and regular), Sweet Tea, Candies, Chips, Cookies, Sweet Pastries, Store Bought Juices, Alcohol in Excess of 1-2 drinks a day, Artificial Sweeteners, Coffee Creamer, and "Sugar-free" Products. This will help patient to have stable blood glucose profile and potentially avoid unintended weight gain.   - I encouraged the patient to switch to unprocessed or minimally processed complex starch and increased protein intake (animal or plant source), fruits, and vegetables.   - Patient is advised to stick to a routine mealtimes to eat 3 meals a day and avoid unnecessary snacks (to snack only to correct hypoglycemia).  - she will be scheduled with Norm Salt, RDN, CDE for diabetes education.  - I have approached her with the following individualized plan to manage  her diabetes and patient agrees:   -Given her continued hyperglycemia, she is advised to increase her Toujeo to 40 units SQ nightly, continue Rybelsus 7 mg po daily, Glipizide 5 mg XL daily with breakfast and Metformin 1000 mg ER daily with breakfast. We also discussed the importance of cutting out late night snacking.  -She is encouraged to continue monitoring blood glucose at least twice daily, before breakfast and before bed, and to call the clinic if she has  readings less than 70 or greater than 300 for 3 tests in a row.    - Specific targets for  A1c;  LDL, HDL,  and Triglycerides were discussed with the patient.  2) Blood Pressure /Hypertension:  her blood pressure is controlled to target.  She is advised to continue Losartan 25 mg po daily.  3) Lipids/Hyperlipidemia:    Her most recent lipid panel from 10/16/21 shows controlled LDL of 62 and elevated triglycerides of 597.  She is advised to continue Lipitor 10 mg po daily at bedtime.  Side effects and precautions discussed with her.  She is advised to avoid fried foods and butter.    4)  Weight/Diet:  her Body mass index is 39.69 kg/m.  -  clearly complicating her diabetes care.   she is a candidate for weight loss. I discussed with her the fact that loss of 5 - 10% of her  current body weight will have the most impact on her diabetes management.  Exercise, and detailed carbohydrates information provided  -  detailed on discharge instructions.  5) Chronic Care/Health Maintenance: -she is on Statin medications and is encouraged to initiate and continue to follow up with Ophthalmology, Dentist, Podiatrist at least yearly or according to recommendations, and advised to stay away from smoking. I have recommended yearly flu vaccine and pneumonia vaccine at least every 5 years; moderate intensity exercise for up to 150 minutes weekly; and sleep for at least 7 hours a day.  - she is advised to maintain close follow up with Sasser, Clarene Critchley, MD for primary care needs, as well as her other providers for optimal and coordinated care.      I spent 48 minutes in the care of the patient today including review of labs from CMP, Lipids,  Thyroid Function, Hematology (current and previous including abstractions from other facilities); face-to-face time discussing  her blood glucose readings/logs, discussing hypoglycemia and hyperglycemia episodes and symptoms, medications doses, her options of short and long term  treatment based on the latest standards of care / guidelines;  discussion about incorporating lifestyle medicine;  and documenting the encounter.    Please refer to Patient Instructions for Blood Glucose Monitoring and Insulin/Medications Dosing Guide"  in media tab for additional information. Please  also refer to " Patient Self Inventory" in the Media  tab for reviewed elements of pertinent patient history.  Monique HollingsheadAngel L Rozas participated in the discussions, expressed understanding, and voiced agreement with the above plans.  All questions were answered to her satisfaction. she is encouraged to contact clinic should she have any questions or concerns prior to her return visit.    Follow up plan: - Return in about 3 months (around 04/18/2022) for Diabetes F/U with A1c in office, Previsit labs, Bring meter and logs.    Ronny BaconWhitney Teralyn Mullins, [facility] Bro[facility] ArrowFNP-BC [facility] [address] [place], [place] [address] Phone: [phone] Fax: [phone]  01/18/2022, 7:33 PM

## 2022-01-18 NOTE — Patient Instructions (Signed)

## 2022-03-21 ENCOUNTER — Ambulatory Visit (INDEPENDENT_AMBULATORY_CARE_PROVIDER_SITE_OTHER): Payer: BC Managed Care – PPO | Admitting: Podiatry

## 2022-03-21 ENCOUNTER — Encounter: Payer: Self-pay | Admitting: Podiatry

## 2022-03-21 ENCOUNTER — Other Ambulatory Visit: Payer: Self-pay

## 2022-03-21 DIAGNOSIS — L03031 Cellulitis of right toe: Secondary | ICD-10-CM

## 2022-03-21 DIAGNOSIS — L600 Ingrowing nail: Secondary | ICD-10-CM | POA: Diagnosis not present

## 2022-03-21 DIAGNOSIS — E139 Other specified diabetes mellitus without complications: Secondary | ICD-10-CM

## 2022-03-21 MED ORDER — DOXYCYCLINE HYCLATE 100 MG PO TABS: 100.0000 mg | ORAL_TABLET | Freq: Two times a day (BID) | ORAL | 0 refills | Status: AC

## 2022-03-21 NOTE — Patient Instructions (Addendum)

## 2022-03-21 NOTE — Progress Notes (Signed)
?  Subjective:  ?Patient ID: Monique Foster, female    DOB: 1976-07-13,   MRN: [id number] ? ?Chief Complaint  ?Patient presents with  ? Nail Problem  ?  right great toenail infected ?  ? ? ?46 y.o. female presents for concern of right great toenail infection. Relates 5-7 days ago the are of the previous nail removal started getting red swollen. Bleeding a pus. Relates pain. States the whole nail has grown back twice since the initial procedure. Denies any fever but she is diabetic. Last A1c was 8.6. Denies any other pedal complaints. Denies n/v/f/c.  ? ?Past Medical History:  ?Diagnosis Date  ? Asthma   ? Diabetes mellitus without complication (HCC)   ? GERD (gastroesophageal reflux disease)   ? ? ?Objective:  ?Physical Exam: ?Vascular: DP/PT pulses 2/4 bilateral. CFT <3 seconds. Normal hair growth on digits. No edema.  ?Skin. No lacerations or abrasions bilateral feet. Right hallux nail with medial and lateral as well as proximal border erythematous and with edema and purulence noted. Tender to the touch.  ?Musculoskeletal: MMT 5/5 bilateral lower extremities in DF, PF, Inversion and Eversion. Deceased ROM in DF of ankle joint.  ?Neurological: Sensation intact to light touch.  ? ?Assessment:  ? ?1. 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 ? ? ?Plan:  ?Patient was evaluated and treated and all questions answered. ?Patient requesting removal of ingrown nail today. Procedure below.  ?Discussed matrixectomy and potential failure due to infection. Discussed may need to do procedure in about month to make sure we kill the nail root vs taking to OR for matrix removal.  ?Keflex send to pharmacy.  ?Discussed procedure and post procedure care and patient expressed understanding.  ?Will follow-up in 2 weeks for nail check or sooner if any problems arise.  ? ? ?Procedure:  ?Procedure: total Nail Avulsion of right hallux nail ?Surgeon: Louann Sjogren, DPM  ?Pre-op Dx: Ingrown toenail with  infection ?Post-op: Same  ?Place of Surgery: Office exam room.  ?Indications for surgery: Painful and ingrown toenail.  ? ? ?The patient is requesting removal of nail with chemical matrixectomy. Risks and complications were discussed with the patient for which they understand and written consent was obtained. Under sterile conditions a total of 3 mL of  1% lidocaine plain was infiltrated in a hallux block fashion. Once anesthetized, the skin was prepped in sterile fashion. A tourniquet was then applied. Next the entire right hallux nail was removed.   Next phenol was then applied under standard conditions and copiously irrigated. Silvadene was applied. A dry sterile dressing was applied. After application of the dressing the tourniquet was removed and there is found to be an immediate capillary refill time to the digit. The patient tolerated the procedure well without any complications. Post procedure instructions were discussed the patient for which he verbally understood. Follow-up in two weeks for nail check or sooner if any problems are to arise. Discussed signs/symptoms of infection and directed to call the office immediately should any occur or go directly to the emergency room. In the meantime, encouraged to call the office with any questions, concerns, changes symptoms. ? ? ?Louann Sjogren, DPM  ? ? ?

## 2022-03-22 DIAGNOSIS — R7989 Other specified abnormal findings of blood chemistry: Secondary | ICD-10-CM | POA: Diagnosis not present

## 2022-03-22 DIAGNOSIS — E782 Mixed hyperlipidemia: Secondary | ICD-10-CM | POA: Diagnosis not present

## 2022-03-22 DIAGNOSIS — E781 Pure hyperglyceridemia: Secondary | ICD-10-CM | POA: Diagnosis not present

## 2022-03-26 DIAGNOSIS — E7849 Other hyperlipidemia: Secondary | ICD-10-CM | POA: Diagnosis not present

## 2022-03-26 DIAGNOSIS — Z1389 Encounter for screening for other disorder: Secondary | ICD-10-CM | POA: Diagnosis not present

## 2022-03-26 DIAGNOSIS — E1165 Type 2 diabetes mellitus with hyperglycemia: Secondary | ICD-10-CM | POA: Diagnosis not present

## 2022-03-26 DIAGNOSIS — G4733 Obstructive sleep apnea (adult) (pediatric): Secondary | ICD-10-CM | POA: Diagnosis not present

## 2022-03-26 DIAGNOSIS — I10 Essential (primary) hypertension: Secondary | ICD-10-CM | POA: Diagnosis not present

## 2022-03-26 DIAGNOSIS — R7989 Other specified abnormal findings of blood chemistry: Secondary | ICD-10-CM | POA: Diagnosis not present

## 2022-03-26 DIAGNOSIS — Z1331 Encounter for screening for depression: Secondary | ICD-10-CM | POA: Diagnosis not present

## 2022-03-28 ENCOUNTER — Telehealth: Payer: Self-pay | Admitting: *Deleted

## 2022-03-28 ENCOUNTER — Other Ambulatory Visit: Payer: Self-pay | Admitting: Podiatry

## 2022-03-28 MED ORDER — FLUCONAZOLE 150 MG PO TABS: 150.0000 mg | ORAL_TABLET | Freq: Once | ORAL | 0 refills | Status: AC

## 2022-03-28 NOTE — Telephone Encounter (Signed)
Patient is calling because she is having a reaction to antibiotic, possible yeast infection. Can something be called in(diclofenac?)sent to pharmacy on file.Please advise. ?

## 2022-03-29 DIAGNOSIS — R7989 Other specified abnormal findings of blood chemistry: Secondary | ICD-10-CM | POA: Diagnosis not present

## 2022-03-29 NOTE — Telephone Encounter (Signed)
Called patient, no answer, left vmessage of Dr Dionne Bucy instructions/recommendations.

## 2022-04-04 ENCOUNTER — Ambulatory Visit: Payer: BC Managed Care – PPO | Admitting: Podiatry

## 2022-04-11 DIAGNOSIS — E119 Type 2 diabetes mellitus without complications: Secondary | ICD-10-CM | POA: Diagnosis not present

## 2022-04-12 LAB — COMPREHENSIVE METABOLIC PANEL
ALT: 44 IU/L — ABNORMAL HIGH (ref 0–32)
AST: 39 IU/L (ref 0–40)
Albumin/Globulin Ratio: 1.7 (ref 1.2–2.2)
Albumin: 4.3 g/dL (ref 3.8–4.8)
Alkaline Phosphatase: 86 IU/L (ref 44–121)
BUN/Creatinine Ratio: 21 (ref 9–23)
BUN: 11 mg/dL (ref 6–24)
Bilirubin Total: 0.5 mg/dL (ref 0.0–1.2)
CO2: 26 mmol/L (ref 20–29)
Calcium: 9.1 mg/dL (ref 8.7–10.2)
Chloride: 99 mmol/L (ref 96–106)
Creatinine, Ser: 0.52 mg/dL — ABNORMAL LOW (ref 0.57–1.00)
Globulin, Total: 2.5 g/dL (ref 1.5–4.5)
Glucose: 243 mg/dL — ABNORMAL HIGH (ref 70–99)
Potassium: 4.4 mmol/L (ref 3.5–5.2)
Sodium: 140 mmol/L (ref 134–144)
Total Protein: 6.8 g/dL (ref 6.0–8.5)
eGFR: 118 mL/min/{1.73_m2} (ref >59)

## 2022-04-18 ENCOUNTER — Encounter: Payer: Self-pay | Admitting: Nurse Practitioner

## 2022-04-18 ENCOUNTER — Ambulatory Visit (INDEPENDENT_AMBULATORY_CARE_PROVIDER_SITE_OTHER): Payer: BC Managed Care – PPO | Admitting: Nurse Practitioner

## 2022-04-18 VITALS — BP 106/72 | HR 91 | Ht 64.0 in | Wt 231.0 lb

## 2022-04-18 DIAGNOSIS — I10 Essential (primary) hypertension: Secondary | ICD-10-CM | POA: Diagnosis not present

## 2022-04-18 DIAGNOSIS — E782 Mixed hyperlipidemia: Secondary | ICD-10-CM | POA: Diagnosis not present

## 2022-04-18 DIAGNOSIS — E119 Type 2 diabetes mellitus without complications: Secondary | ICD-10-CM | POA: Diagnosis not present

## 2022-04-18 LAB — POCT GLYCOSYLATED HEMOGLOBIN (HGB A1C): HbA1c, POC (controlled diabetic range): 9.1 % — AB (ref 0.0–7.0)

## 2022-04-18 MED ORDER — TIRZEPATIDE 5 MG/0.5ML ~~LOC~~ SOAJ: 5.0000 mg | SUBCUTANEOUS | 3 refills | Status: DC

## 2022-04-18 NOTE — Patient Instructions (Signed)
Diabetes Mellitus and Foot Care Foot care is an important part of your health, especially when you have diabetes. Diabetes may cause you to have problems because of poor blood flow (circulation) to your feet and legs, which can cause your skin to: Become thinner and drier. Break more easily. Heal more slowly. Peel and crack. You may also have nerve damage (neuropathy) in your legs and feet, causing decreased feeling in them. This means that you may not notice minor injuries to your feet that could lead to more serious problems. Noticing and addressing any potential problems early is the best way to prevent future foot problems. How to care for your feet Foot hygiene  Wash your feet daily with warm water and mild soap. Do not use hot water. Then, pat your feet and the areas between your toes until they are completely dry. Do not soak your feet as this can dry your skin. Trim your toenails straight across. Do not dig under them or around the cuticle. File the edges of your nails with an emery board or nail file. Apply a moisturizing lotion or petroleum jelly to the skin on your feet and to dry, brittle toenails. Use lotion that does not contain alcohol and is unscented. Do not apply lotion between your toes. Shoes and socks Wear clean socks or stockings every day. Make sure they are not too tight. Do not wear knee-high stockings since they may decrease blood flow to your legs. Wear shoes that fit properly and have enough cushioning. Always look in your shoes before you put them on to be sure there are no objects inside. To break in new shoes, wear them for just a few hours a day. This prevents injuries on your feet. Wounds, scrapes, corns, and calluses  Check your feet daily for blisters, cuts, bruises, sores, and redness. If you cannot see the bottom of your feet, use a mirror or ask someone for help. Do not cut corns or calluses or try to remove them with medicine. If you find a minor scrape,  cut, or break in the skin on your feet, keep it and the skin around it clean and dry. You may clean these areas with mild soap and water. Do not clean the area with peroxide, alcohol, or iodine. If you have a wound, scrape, corn, or callus on your foot, look at it several times a day to make sure it is healing and not infected. Check for: Redness, swelling, or pain. Fluid or blood. Warmth. Pus or a bad smell. General tips Do not cross your legs. This may decrease blood flow to your feet. Do not use heating pads or hot water bottles on your feet. They may burn your skin. If you have lost feeling in your feet or legs, you may not know this is happening until it is too late. Protect your feet from hot and cold by wearing shoes, such as at the beach or on hot pavement. Schedule a complete foot exam at least once a year (annually) or more often if you have foot problems. Report any cuts, sores, or bruises to your health care provider immediately. Where to find more information American Diabetes Association: www.diabetes.org Association of Diabetes Care & Education Specialists: www.diabeteseducator.org Contact a health care provider if: You have a medical condition that increases your risk of infection and you have any cuts, sores, or bruises on your feet. You have an injury that is not healing. You have redness on your legs or feet. You   feel burning or tingling in your legs or feet. You have pain or cramps in your legs and feet. Your legs or feet are numb. Your feet always feel cold. You have pain around any toenails. Get help right away if: You have a wound, scrape, corn, or callus on your foot and: You have pain, swelling, or redness that gets worse. You have fluid or blood coming from the wound, scrape, corn, or callus. Your wound, scrape, corn, or callus feels warm to the touch. You have pus or a bad smell coming from the wound, scrape, corn, or callus. You have a fever. You have a red  line going up your leg. Summary Check your feet every day for blisters, cuts, bruises, sores, and redness. Apply a moisturizing lotion or petroleum jelly to the skin on your feet and to dry, brittle toenails. Wear shoes that fit properly and have enough cushioning. If you have foot problems, report any cuts, sores, or bruises to your health care provider immediately. Schedule a complete foot exam at least once a year (annually) or more often if you have foot problems. This information is not intended to replace advice given to you by your health care provider. Make sure you discuss any questions you have with your health care provider. Document Revised: 06/30/2020 Document Reviewed: 06/30/2020 Elsevier Patient Education  2023 [employer]  

## 2022-04-18 NOTE — Progress Notes (Signed)
? ?                                                    Endocrinology Follow Up Note  ?     04/18/2022, 3:32 PM ? ? ?Subjective:  ? ? Patient ID: Monique Foster, female    DOB: 11-04-1976.  ?Monique Foster is being seen in follow up after being seen in consultation for management of currently uncontrolled symptomatic diabetes requested by  Manon Hilding, MD. ? ? ?Past Medical History:  ?Diagnosis Date  ? Asthma   ? Diabetes mellitus without complication ([place])   ? GERD (gastroesophageal reflux disease)   ? ? ?Past Surgical History:  ?Procedure Laterality Date  ? CESAREAN SECTION    ? TUBAL LIGATION    ? ? ?Social History  ? ?Socioeconomic History  ? Marital status: Married  ?  Spouse name: Not on file  ? Number of children: Not on file  ? Years of education: Not on file  ? Highest education level: Not on file  ?Occupational History  ? Not on file  ?Tobacco Use  ? Smoking status: Former  ? Smokeless tobacco: Never  ?Vaping Use  ? Vaping Use: Never used  ?Substance and Sexual Activity  ? Alcohol use: No  ? Drug use: No  ? Sexual activity: Not on file  ?Other Topics Concern  ? Not on file  ?Social History Narrative  ? Not on file  ? ?Social Determinants of Health  ? ?Financial Resource Strain: Not on file  ?Food Insecurity: Not on file  ?Transportation Needs: Not on file  ?Physical Activity: Not on file  ?Stress: Not on file  ?Social Connections: Not on file  ? ? ?Family History  ?Problem Relation Age of Onset  ? Diabetes Mother   ? Osteoporosis Mother   ? Diabetes Father   ? Heart attack Father   ? ? ?Outpatient Encounter Medications as of 04/18/2022  ?Medication Sig  ? atorvastatin (LIPITOR) 10 MG tablet Take 10 mg by mouth daily.  ? BINAXNOW COVID-19 AG HOME TEST KIT Use as Directed on the Package  ? cetirizine (ZYRTEC) 10 MG tablet Take 10 mg by mouth daily.  ? esomeprazole (NEXIUM) 20 MG capsule Take 20 mg by mouth daily.   ? glipiZIDE (GLUCOTROL XL) 5 MG 24 hr tablet Take 1 tablet (5 mg total) by  mouth daily with breakfast.  ? insulin glargine, 2 Unit Dial, (TOUJEO MAX SOLOSTAR) 300 UNIT/ML Solostar Pen Inject 40 Units into the skin at bedtime.  ? Insulin Pen Needle (PEN NEEDLES) 31G X 5 MM MISC Use to inject insulin once daily  ? losartan (COZAAR) 25 MG tablet Take 25 mg by mouth daily.  ? metFORMIN (GLUCOPHAGE-XR) 500 MG 24 hr tablet Take 2 tablets (1,000 mg total) by mouth daily with breakfast.  ? Multiple Vitamin (MULTIVITAMIN) capsule Take 1 capsule by mouth daily.  ? OVER THE COUNTER MEDICATION AMbren daily for hot flashes  ? RELION PEN NEEDLES 32G X 4 MM MISC SMARTSIG:Injection Daily  ? tirzepatide (MOUNJARO) 5 MG/0.5ML Pen Inject 5 mg into the skin once a week.  ? [DISCONTINUED] RYBELSUS 7 MG TABS Take 1 tablet by mouth daily.  ? ?No facility-administered encounter medications on file as of 04/18/2022.  ? ? ?ALLERGIES: ?Allergies  ?Allergen Reactions  ? Iodine Shortness  Of Breath and Rash  ? Levofloxacin Swelling  ?  Facial Area ?Facial Area  ? Shellfish Allergy Shortness Of Breath, Nausea And Vomiting and Rash  ? Shellfish-Derived Products Nausea And Vomiting, Rash and Shortness Of Breath  ? Latex Itching and Rash  ? Penicillins Rash  ? ? ?VACCINATION STATUS: ? ?There is no immunization history on file for this patient. ? ?Diabetes ?She presents for her follow-up diabetic visit. She has type 2 diabetes mellitus. Onset time: Diagnosed at approx age of 46. Her disease course has been worsening. There are no hypoglycemic associated symptoms. Pertinent negatives for diabetes include no fatigue and no weight loss. There are no hypoglycemic complications. Symptoms are stable. There are no diabetic complications. Risk factors for coronary artery disease include diabetes mellitus, dyslipidemia, obesity and sedentary lifestyle. Current diabetic treatment includes oral agent (triple therapy) and insulin injections. She is compliant with treatment most of the time. Her weight is fluctuating minimally. She is  following a generally unhealthy diet. When asked about meal planning, she reported none. She has not had a previous visit with a dietitian. She participates in exercise intermittently. Her home blood glucose trend is increasing steadily. Her breakfast blood glucose range is generally >200 mg/dl. Her bedtime blood glucose range is generally >200 mg/dl. Her overall blood glucose range is >200 mg/dl. (She presents today with her logs showing above target glycemic profile overall.  Her POCT A1c today is 9.1%, increasing from last visit of 8.6%.  She has a tendency to snack at night when she gets home from work.  She also had COVID and an infected toe in between visits which may have contributed to those higher readings.  She mentioned she had an ultrasound of her liver which showed fatty liver and her PCP recommended weight loss.) An ACE inhibitor/angiotensin II receptor blocker is being taken. She does not see a podiatrist.Eye exam is current.  ?Hyperlipidemia ?This is a chronic problem. The current episode started more than 1 year ago. The problem is uncontrolled. Recent lipid tests were reviewed and are variable. Exacerbating diseases include diabetes and obesity. Factors aggravating her hyperlipidemia include fatty foods. Current antihyperlipidemic treatment includes statins. The current treatment provides mild improvement of lipids. Compliance problems include adherence to diet and adherence to exercise.  Risk factors for coronary artery disease include diabetes mellitus, dyslipidemia, obesity and hypertension.  ?Hypertension ?This is a chronic problem. The current episode started more than 1 year ago. The problem is unchanged. The problem is controlled. There are no associated agents to hypertension. Risk factors for coronary artery disease include diabetes mellitus, dyslipidemia and obesity. Past treatments include angiotensin blockers. Compliance problems include diet and exercise.  Identifiable causes of  hypertension include sleep apnea.  ? ? ?Review of systems ? ?Constitutional: + Minimally fluctuating body weight,  current Body mass index is 39.65 kg/m?. , no fatigue, no subjective hyperthermia, no subjective hypothermia ?Eyes: no blurry vision, no xerophthalmia ?ENT: no sore throat, no nodules palpated in throat, no dysphagia/odynophagia, no hoarseness ?Cardiovascular: no chest pain, no shortness of breath, no palpitations, no leg swelling ?Respiratory: no cough, no shortness of breath ?Gastrointestinal: no nausea/vomiting/diarrhea ?Musculoskeletal: no muscle/joint aches ?Skin: no rashes, no hyperemia ?Neurological: no tremors, no numbness, no tingling, no dizziness ?Psychiatric: no depression, no anxiety ? ? ?Objective:  ?  ? ?BP 106/72   Pulse 91   Ht 5' 4"  (1.626 m)   Wt 231 lb (104.8 kg)   SpO2 95%   BMI 39.65 kg/m?   ?  Wt Readings from Last 3 Encounters:  ?04/18/22 231 lb (104.8 kg)  ?01/18/22 231 lb 3.2 oz (104.9 kg)  ?10/18/21 228 lb (103.4 kg)  ?  ? ?BP Readings from Last 3 Encounters:  ?04/18/22 106/72  ?01/18/22 112/75  ?10/18/21 116/75  ?  ? ? ? ?Physical Exam- Limited ? ?Constitutional:  Body mass index is 39.65 kg/m?. , not in acute distress, normal state of mind ?Eyes:  EOMI, no exophthalmos ?Neck: Supple ?Cardiovascular: RRR, no murmurs, rubs, or gallops, no edema ?Respiratory: Adequate breathing efforts, no crackles, rales, rhonchi, or wheezing ?Musculoskeletal: no gross deformities, strength intact in all four extremities, no gross restriction of joint movements ?Skin:  no rashes, no hyperemia ?Neurological: no tremor with outstretched hands ? ? ? ?Diabetic Foot Exam - Simple   ?Simple Foot Form ?Diabetic Foot exam was performed with the following findings: Yes 04/18/2022  3:33 PM  ?Visual Inspection ?See comments: Yes ?Sensation Testing ?Intact to touch and monofilament testing bilaterally: Yes ?Pulse Check ?Posterior Tibialis and Dorsalis pulse intact bilaterally: Yes ?Comments ?Right great  toe erythematous, nontender, warm to touch, not hot. recently had nail removed by podiatry- ?  ? ? ?CMP ( most recent) ?CMP  ?   ?Component Value Date/Time  ? NA 140 04/11/2022 0829  ? K 4.4 04/11/2022 0829  ? CL 99

## 2022-07-18 ENCOUNTER — Encounter: Payer: BC Managed Care – PPO | Admitting: Nurse Practitioner

## 2022-07-18 ENCOUNTER — Encounter: Payer: Self-pay | Admitting: Nurse Practitioner

## 2022-07-18 ENCOUNTER — Ambulatory Visit (INDEPENDENT_AMBULATORY_CARE_PROVIDER_SITE_OTHER): Payer: BC Managed Care – PPO | Admitting: Podiatry

## 2022-07-18 ENCOUNTER — Encounter: Payer: Self-pay | Admitting: Podiatry

## 2022-07-18 VITALS — Ht 64.0 in | Wt 227.0 lb

## 2022-07-18 DIAGNOSIS — Z1231 Encounter for screening mammogram for malignant neoplasm of breast: Secondary | ICD-10-CM | POA: Diagnosis not present

## 2022-07-18 DIAGNOSIS — L03031 Cellulitis of right toe: Secondary | ICD-10-CM

## 2022-07-18 NOTE — Progress Notes (Signed)
Erroneous encounter

## 2022-07-18 NOTE — Patient Instructions (Signed)

## 2022-07-18 NOTE — Progress Notes (Signed)
  Subjective:  Patient ID: Monique Foster, female    DOB: 06-Nov-1976,   MRN: [id number]  Chief Complaint  Patient presents with   Foot Problem    right great toenail removal    46 y.o. female presents for concern of right great toenail growing back. She has had procedure done three times already twice with chemical. Here today to have it done again prior to having any infection. Denies any fever but she is diabetic. Last A1c was 8.6. Denies any other pedal complaints. Denies n/v/f/c.   Past Medical History:  Diagnosis Date   Asthma    Diabetes mellitus without complication ([facility])    GERD (gastroesophageal reflux disease)     Objective:  Physical Exam: Vascular: DP/PT pulses 2/4 bilateral. CFT <3 seconds. Normal hair growth on digits. No edema.  Skin. No lacerations or abrasions bilateral feet. Right hallux nail small growth noted. No erythema edema or purulence today.  Musculoskeletal: MMT 5/5 bilateral lower extremities in DF, PF, Inversion and Eversion. Deceased ROM in DF of ankle joint.  Neurological: Sensation intact to light touch.   Assessment:   1. Paronychia of great toe, right       Plan:  Patient was evaluated and treated and all questions answered. Patient requesting removal of ingrown nail today. Procedure below.  Discussed procedure and post procedure care and patient expressed understanding.  Will follow-up in 2 weeks for nail check or sooner if any problems arise.    Procedure:  Procedure: total Nail Avulsion of right hallux nail Surgeon: Louann Sjogren, DPM  Pre-op Dx: Ingrown toenail with infection Post-op: Same  Place of Surgery: Office exam room.  Indications for surgery: Painful and ingrown toenail.    The patient is requesting removal of nail with chemical matrixectomy. Risks and complications were discussed with the patient for which they understand and written consent was obtained. Under sterile conditions a total of 3 mL of  1% lidocaine plain was  infiltrated in a hallux block fashion. Once anesthetized, the skin was prepped in sterile fashion. A tourniquet was then applied. Next the entire right hallux nail was removed.   Next phenol was then applied under standard conditions and copiously irrigated. Silvadene was applied. A dry sterile dressing was applied. After application of the dressing the tourniquet was removed and there is found to be an immediate capillary refill time to the digit. The patient tolerated the procedure well without any complications. Post procedure instructions were discussed the patient for which he verbally understood. Follow-up in two weeks for nail check or sooner if any problems are to arise. Discussed signs/symptoms of infection and directed to call the office immediately should any occur or go directly to the emergency room. In the meantime, encouraged to call the office with any questions, concerns, changes symptoms.   Louann Sjogren, DPM

## 2022-07-25 ENCOUNTER — Ambulatory Visit (INDEPENDENT_AMBULATORY_CARE_PROVIDER_SITE_OTHER): Payer: BC Managed Care – PPO | Admitting: Nurse Practitioner

## 2022-07-25 ENCOUNTER — Encounter: Payer: Self-pay | Admitting: Nurse Practitioner

## 2022-07-25 VITALS — BP 123/84 | HR 78 | Ht 64.0 in | Wt 228.0 lb

## 2022-07-25 DIAGNOSIS — E119 Type 2 diabetes mellitus without complications: Secondary | ICD-10-CM | POA: Diagnosis not present

## 2022-07-25 DIAGNOSIS — E782 Mixed hyperlipidemia: Secondary | ICD-10-CM

## 2022-07-25 DIAGNOSIS — I10 Essential (primary) hypertension: Secondary | ICD-10-CM | POA: Diagnosis not present

## 2022-07-25 LAB — POCT GLYCOSYLATED HEMOGLOBIN (HGB A1C): Hemoglobin A1C: 8 % — AB (ref 4.0–5.6)

## 2022-07-25 MED ORDER — TIRZEPATIDE 7.5 MG/0.5ML ~~LOC~~ SOAJ: 7.5000 mg | SUBCUTANEOUS | 0 refills | Status: DC

## 2022-07-25 MED ORDER — METFORMIN HCL ER 500 MG PO TB24: 1000.0000 mg | ORAL_TABLET | Freq: Every day | ORAL | 3 refills | Status: DC

## 2022-07-25 MED ORDER — ONDANSETRON HCL 4 MG PO TABS: 4.0000 mg | ORAL_TABLET | Freq: Three times a day (TID) | ORAL | 0 refills | Status: DC | PRN

## 2022-07-25 MED ORDER — FREESTYLE LIBRE 3 SENSOR MISC: 3 refills | Status: DC

## 2022-07-25 NOTE — Progress Notes (Signed)
Endocrinology Follow Up Note       07/25/2022, 4:13 PM   Subjective:    Patient ID: Monique Foster, female    DOB: 1976-08-09.  Monique Foster is being seen in follow up after being seen in consultation for management of currently uncontrolled symptomatic diabetes requested by  Manon Hilding, MD.   Past Medical History:  Diagnosis Date   Asthma    Diabetes mellitus without complication ([place])    GERD (gastroesophageal reflux disease)     Past Surgical History:  Procedure Laterality Date   CESAREAN SECTION     TUBAL LIGATION      Social History   Socioeconomic History   Marital status: Married    Spouse name: Not on file   Number of children: Not on file   Years of education: Not on file   Highest education level: Not on file  Occupational History   Not on file  Tobacco Use   Smoking status: Former   Smokeless tobacco: Never  Vaping Use   Vaping Use: Never used  Substance and Sexual Activity   Alcohol use: No   Drug use: No   Sexual activity: Not on file  Other Topics Concern   Not on file  Social History Narrative   Not on file   Social Determinants of Health   Financial Resource Strain: Not on file  Food Insecurity: Not on file  Transportation Needs: Not on file  Physical Activity: Not on file  Stress: Not on file  Social Connections: Not on file    Family History  Problem Relation Age of Onset   Diabetes Mother    Osteoporosis Mother    Diabetes Father    Heart attack Father     Outpatient Encounter Medications as of 07/25/2022  Medication Sig   Continuous Blood Gluc Sensor (FREESTYLE LIBRE 3 SENSOR) MISC Place 1 sensor on the skin every 14 days. Use to check glucose continuously   ondansetron (ZOFRAN) 4 MG tablet Take 1 tablet (4 mg total) by mouth every 8 (eight) hours as needed for nausea or vomiting.   tirzepatide (MOUNJARO) 7.5 MG/0.5ML Pen Inject 7.5 mg into the skin once a  week.   atorvastatin (LIPITOR) 10 MG tablet Take 10 mg by mouth daily.   cetirizine (ZYRTEC) 10 MG tablet Take 10 mg by mouth daily.   esomeprazole (NEXIUM) 20 MG capsule Take 20 mg by mouth daily.    insulin glargine, 2 Unit Dial, (TOUJEO MAX SOLOSTAR) 300 UNIT/ML Solostar Pen Inject 40 Units into the skin at bedtime.   Insulin Pen Needle (PEN NEEDLES) 31G X 5 MM MISC Use to inject insulin once daily   losartan (COZAAR) 25 MG tablet Take 25 mg by mouth daily.   metFORMIN (GLUCOPHAGE-XR) 500 MG 24 hr tablet Take 2 tablets (1,000 mg total) by mouth daily with breakfast.   Multiple Vitamin (MULTIVITAMIN) capsule Take 1 capsule by mouth daily.   OVER THE COUNTER MEDICATION AMbren daily for hot flashes   RELION PEN NEEDLES 32G X 4 MM MISC SMARTSIG:Injection Daily   [DISCONTINUED] BINAXNOW COVID-19 AG HOME TEST KIT Use as Directed on the Package   [DISCONTINUED] glipiZIDE (GLUCOTROL XL)  5 MG 24 hr tablet Take 1 tablet (5 mg total) by mouth daily with breakfast.   [DISCONTINUED] metFORMIN (GLUCOPHAGE-XR) 500 MG 24 hr tablet Take 2 tablets (1,000 mg total) by mouth daily with breakfast.   [DISCONTINUED] tirzepatide (MOUNJARO) 5 MG/0.5ML Pen Inject 5 mg into the skin once a week.   No facility-administered encounter medications on file as of 07/25/2022.    ALLERGIES: Allergies  Allergen Reactions   Iodine Shortness Of Breath and Rash   Levofloxacin Swelling    Facial Area Facial Area   Shellfish Allergy Shortness Of Breath, Nausea And Vomiting and Rash   Shellfish-Derived Products Nausea And Vomiting, Rash and Shortness Of Breath   Latex Itching and Rash   Penicillins Rash    VACCINATION STATUS:  There is no immunization history on file for this patient.  Diabetes She presents for her follow-up diabetic visit. She has type 2 diabetes mellitus. Onset time: Diagnosed at approx age of 4. Her disease course has been improving. There are no hypoglycemic associated symptoms. Associated symptoms  include weight loss. Pertinent negatives for diabetes include no fatigue. There are no hypoglycemic complications. Symptoms are stable. There are no diabetic complications. Risk factors for coronary artery disease include diabetes mellitus, dyslipidemia, obesity and sedentary lifestyle. Current diabetic treatment includes oral agent (triple therapy), insulin injections and oral agent (dual therapy) (and Mounjaro). She is compliant with treatment most of the time. Her weight is fluctuating minimally. She is following a generally unhealthy diet. When asked about meal planning, she reported none. She has not had a previous visit with a dietitian. She participates in exercise intermittently. Her home blood glucose trend is decreasing steadily. Her breakfast blood glucose range is generally 140-180 mg/dl. Her bedtime blood glucose range is generally 140-180 mg/dl. (She presents today with her logs, no meter, showing slightly above target fasting and at goal postprandial readings.  Her POCT A1c today is 8%, improving from last visit of 9.1%.  She denies any hypoglycemia.  She has tolerated the Mounjaro well, has lost approx 5 lbs.) An ACE inhibitor/angiotensin II receptor blocker is being taken. She does not see a podiatrist.Eye exam is current.  Hyperlipidemia This is a chronic problem. The current episode started more than 1 year ago. The problem is uncontrolled. Recent lipid tests were reviewed and are variable. Exacerbating diseases include diabetes and obesity. Factors aggravating her hyperlipidemia include fatty foods. Current antihyperlipidemic treatment includes statins. The current treatment provides mild improvement of lipids. Compliance problems include adherence to diet and adherence to exercise.  Risk factors for coronary artery disease include diabetes mellitus, dyslipidemia, obesity and hypertension.  Hypertension This is a chronic problem. The current episode started more than 1 year ago. The problem  is unchanged. The problem is controlled. There are no associated agents to hypertension. Risk factors for coronary artery disease include diabetes mellitus, dyslipidemia and obesity. Past treatments include angiotensin blockers. Compliance problems include diet and exercise.  Identifiable causes of hypertension include sleep apnea.    Review of systems  Constitutional: + Minimally fluctuating body weight,  current Body mass index is 39.14 kg/m. , no fatigue, no subjective hyperthermia, no subjective hypothermia Eyes: no blurry vision, no xerophthalmia ENT: no sore throat, no nodules palpated in throat, no dysphagia/odynophagia, no hoarseness Cardiovascular: no chest pain, no shortness of breath, no palpitations, no leg swelling Respiratory: no cough, no shortness of breath Gastrointestinal: + mild nausea, no vomiting/diarrhea Musculoskeletal: no muscle/joint aches Skin: no rashes, no hyperemia Neurological: no tremors, no numbness,  no tingling, no dizziness Psychiatric: no depression, no anxiety   Objective:     BP 123/84   Pulse 78   Ht 5' 4" (1.626 m)   Wt 228 lb (103.4 kg)   BMI 39.14 kg/m   Wt Readings from Last 3 Encounters:  07/25/22 228 lb (103.4 kg)  07/18/22 227 lb (103 kg)  04/18/22 231 lb (104.8 kg)     BP Readings from Last 3 Encounters:  07/25/22 123/84  04/18/22 106/72  01/18/22 112/75      Physical Exam- Limited  Constitutional:  Body mass index is 39.14 kg/m. , not in acute distress, normal state of mind Eyes:  EOMI, no exophthalmos Neck: Supple Cardiovascular: RRR, no murmurs, rubs, or gallops, no edema Respiratory: Adequate breathing efforts, no crackles, rales, rhonchi, or wheezing Musculoskeletal: no gross deformities, strength intact in all four extremities, no gross restriction of joint movements Skin:  no rashes, no hyperemia Neurological: no tremor with outstretched hands    Diabetic Foot Exam - Simple   No data filed     CMP ( most  recent) CMP     Component Value Date/Time   NA 140 04/11/2022 0829   K 4.4 04/11/2022 0829   CL 99 04/11/2022 0829   CO2 26 04/11/2022 0829   GLUCOSE 243 (H) 04/11/2022 0829   GLUCOSE 141 (H) 04/15/2012 0247   BUN 11 04/11/2022 0829   CREATININE 0.52 (L) 04/11/2022 0829   CALCIUM 9.1 04/11/2022 0829   PROT 6.8 04/11/2022 0829   ALBUMIN 4.3 04/11/2022 0829   AST 39 04/11/2022 0829   ALT 44 (H) 04/11/2022 0829   ALKPHOS 86 04/11/2022 0829   BILITOT 0.5 04/11/2022 0829   GFRNONAA 116 08/17/2020 0000   GFRAA >90 04/15/2012 0247     Diabetic Labs (most recent): Lab Results  Component Value Date   HGBA1C 9.1 (A) 04/18/2022   HGBA1C 8.6 (A) 01/18/2022   HGBA1C 8.5 (A) 10/18/2021   MICROALBUR 30 10/18/2021     Lipid Panel ( most recent) Lipid Panel     Component Value Date/Time   CHOL 186 10/16/2021 0845   TRIG 597 (HH) 10/16/2021 0845   HDL 33 (L) 10/16/2021 0845   CHOLHDL 5.6 (H) 10/16/2021 0845   LDLCALC 62 10/16/2021 0845   LABVLDL 91 (H) 10/16/2021 0845      Lab Results  Component Value Date   TSH 1.330 10/16/2021   FREET4 1.17 10/16/2021           Assessment & Plan:   1) Type 2 diabetes mellitus without complication, without long-term current use of insulin ([place])  She presents today with her logs, no meter, showing slightly above target fasting and at goal postprandial readings.  Her POCT A1c today is 8%, improving from last visit of 9.1%.  She denies any hypoglycemia.  She has tolerated the Mounjaro well, has lost approx 5 lbs.  - Monique Foster has currently uncontrolled symptomatic type 2 DM since 46 years of age.  -Recent labs reviewed.  She has elevated LFTs, and triglycerides, may be due to fatty liver?  PCP following along for this.  - I had a long discussion with her about the progressive nature of diabetes and the pathology behind its complications. -her diabetes is not currently complicated but she remains at a high risk for more acute and  chronic complications which include CAD, CVA, CKD, retinopathy, and neuropathy. These are all discussed in detail with her.  - Nutritional counseling repeated at each  appointment due to patients tendency to fall back in to old habits.  - The patient admits there is a room for improvement in their diet and drink choices. -  Suggestion is made for the patient to avoid simple carbohydrates from their diet including Cakes, Sweet Desserts / Pastries, Ice Cream, Soda (diet and regular), Sweet Tea, Candies, Chips, Cookies, Sweet Pastries, Store Bought Juices, Alcohol in Excess of 1-2 drinks a day, Artificial Sweeteners, Coffee Creamer, and "Sugar-free" Products. This will help patient to have stable blood glucose profile and potentially avoid unintended weight gain.   - I encouraged the patient to switch to unprocessed or minimally processed complex starch and increased protein intake (animal or plant source), fruits, and vegetables.   - Patient is advised to stick to a routine mealtimes to eat 3 meals a day and avoid unnecessary snacks (to snack only to correct hypoglycemia).  - she will be scheduled with Jearld Fenton, RDN, CDE for diabetes education.  - I have approached her with the following individualized plan to manage  her diabetes and patient agrees:   -She is advised to continue her Toujeo 40 units SQ nightly and Metformin 1000 mg ER daily with breakfast.  Will increase her Mounjaro to 7.5 mg SQ weekly and stop her Glipizide for now.   -She is encouraged to continue monitoring blood glucose at least twice daily, before breakfast and before bed, and to call the clinic if she has readings less than 70 or greater than 300 for 3 tests in a row.  She could benefit from CGM device, I sent in for [place] 3 to her pharmacy.  - Specific targets for  A1c;  LDL, HDL,  and Triglycerides were discussed with the patient.  2) Blood Pressure /Hypertension:  her blood pressure is controlled to target.  She is  advised to continue Losartan 25 mg po daily.  3) Lipids/Hyperlipidemia:    Her most recent lipid panel from 10/16/21 shows controlled LDL of 62 and elevated triglycerides of 597.  She is advised to continue Lipitor 10 mg po daily at bedtime.  Side effects and precautions discussed with her.  She is advised to avoid fried foods and butter.  Will recheck lipid panel prior to next visit.  4)  Weight/Diet:  her Body mass index is 39.14 kg/m.  -  clearly complicating her diabetes care.   she is a candidate for weight loss. I discussed with her the fact that loss of 5 - 10% of her  current body weight will have the most impact on her diabetes management.  Exercise, and detailed carbohydrates information provided  -  detailed on discharge instructions.  5) Chronic Care/Health Maintenance: -she is on Statin medications and is encouraged to initiate and continue to follow up with Ophthalmology, Dentist, Podiatrist at least yearly or according to recommendations, and advised to stay away from smoking. I have recommended yearly flu vaccine and pneumonia vaccine at least every 5 years; moderate intensity exercise for up to 150 minutes weekly; and sleep for at least 7 hours a day.  - she is advised to maintain close follow up with Sasser, Silvestre Moment, MD for primary care needs, as well as her other providers for optimal and coordinated care.      I spent 41 minutes in the care of the patient today including review of labs from [place], Lipids, Thyroid Function, Hematology (current and previous including abstractions from other facilities); face-to-face time discussing  her blood glucose readings/logs, discussing hypoglycemia and  hyperglycemia episodes and symptoms, medications doses, her options of short and long term treatment based on the latest standards of care / guidelines;  discussion about incorporating lifestyle medicine;  and documenting the encounter. Risk reduction counseling performed per USPSTF guidelines  to reduce obesity and cardiovascular risk factors.     Please refer to Patient Instructions for Blood Glucose Monitoring and Insulin/Medications Dosing Guide"  in media tab for additional information. Please  also refer to " Patient Self Inventory" in the Media  tab for reviewed elements of pertinent patient history.  Monique Foster participated in the discussions, expressed understanding, and voiced agreement with the above plans.  All questions were answered to her satisfaction. she is encouraged to contact clinic should she have any questions or concerns prior to her return visit.    Follow up plan: - Return in about 3 months (around 10/25/2022) for Diabetes F/U with A1c in office, Previsit labs, Bring meter and logs.   Rayetta Pigg, [facility] [address] [place], [place] [address] Phone: [phone] Fax: [phone]  07/25/2022, 4:13 PM

## 2022-10-09 DIAGNOSIS — E1165 Type 2 diabetes mellitus with hyperglycemia: Secondary | ICD-10-CM | POA: Diagnosis not present

## 2022-10-09 DIAGNOSIS — E7849 Other hyperlipidemia: Secondary | ICD-10-CM | POA: Diagnosis not present

## 2022-10-16 DIAGNOSIS — E1165 Type 2 diabetes mellitus with hyperglycemia: Secondary | ICD-10-CM | POA: Diagnosis not present

## 2022-10-16 DIAGNOSIS — G4733 Obstructive sleep apnea (adult) (pediatric): Secondary | ICD-10-CM | POA: Diagnosis not present

## 2022-10-16 DIAGNOSIS — E7849 Other hyperlipidemia: Secondary | ICD-10-CM | POA: Diagnosis not present

## 2022-10-26 LAB — COMPREHENSIVE METABOLIC PANEL
ALT: 32 IU/L (ref 0–32)
AST: 24 IU/L (ref 0–40)
Albumin/Globulin Ratio: 1.6 (ref 1.2–2.2)
Albumin: 4.5 g/dL (ref 3.9–4.9)
Alkaline Phosphatase: 87 IU/L (ref 44–121)
BUN/Creatinine Ratio: 17 (ref 9–23)
BUN: 11 mg/dL (ref 6–24)
Bilirubin Total: 0.5 mg/dL (ref 0.0–1.2)
CO2: 27 mmol/L (ref 20–29)
Calcium: 9.6 mg/dL (ref 8.7–10.2)
Chloride: 101 mmol/L (ref 96–106)
Creatinine, Ser: 0.63 mg/dL (ref 0.57–1.00)
Globulin, Total: 2.8 g/dL (ref 1.5–4.5)
Glucose: 158 mg/dL — ABNORMAL HIGH (ref 70–99)
Potassium: 4.6 mmol/L (ref 3.5–5.2)
Sodium: 140 mmol/L (ref 134–144)
Total Protein: 7.3 g/dL (ref 6.0–8.5)
eGFR: 112 mL/min/{1.73_m2} (ref >59)

## 2022-10-26 LAB — VITAMIN D 25 HYDROXY (VIT D DEFICIENCY, FRACTURES): Vit D, 25-Hydroxy: 26.3 ng/mL — ABNORMAL LOW (ref 30.0–100.0)

## 2022-10-26 LAB — T4, FREE: Free T4: 1.09 ng/dL (ref 0.82–1.77)

## 2022-10-26 LAB — LIPID PANEL
Chol/HDL Ratio: 4.6 ratio — ABNORMAL HIGH (ref 0.0–4.4)
Cholesterol, Total: 192 mg/dL (ref 100–199)
HDL: 42 mg/dL (ref >39)
LDL Chol Calc (NIH): 83 mg/dL (ref 0–99)
Triglycerides: 414 mg/dL — ABNORMAL HIGH (ref 0–149)
VLDL Cholesterol Cal: 67 mg/dL — ABNORMAL HIGH (ref 5–40)

## 2022-10-26 LAB — TSH: TSH: 1.14 u[IU]/mL (ref 0.450–4.500)

## 2022-10-31 ENCOUNTER — Ambulatory Visit (INDEPENDENT_AMBULATORY_CARE_PROVIDER_SITE_OTHER): Payer: BC Managed Care – PPO | Admitting: Nurse Practitioner

## 2022-10-31 ENCOUNTER — Encounter: Payer: Self-pay | Admitting: Nurse Practitioner

## 2022-10-31 VITALS — BP 97/65 | HR 73 | Ht 64.0 in | Wt 215.2 lb

## 2022-10-31 DIAGNOSIS — E119 Type 2 diabetes mellitus without complications: Secondary | ICD-10-CM | POA: Diagnosis not present

## 2022-10-31 DIAGNOSIS — I10 Essential (primary) hypertension: Secondary | ICD-10-CM | POA: Diagnosis not present

## 2022-10-31 DIAGNOSIS — E782 Mixed hyperlipidemia: Secondary | ICD-10-CM | POA: Diagnosis not present

## 2022-10-31 LAB — POCT GLYCOSYLATED HEMOGLOBIN (HGB A1C): Hemoglobin A1C: 7.3 % — AB (ref 4.0–5.6)

## 2022-10-31 LAB — POCT UA - MICROALBUMIN
Albumin/Creatinine Ratio, Urine, POC: <30
Creatinine, POC: 100 mg/dL
Microalbumin Ur, POC: 10 mg/L

## 2022-10-31 MED ORDER — TIRZEPATIDE 10 MG/0.5ML ~~LOC~~ SOAJ: 10.0000 mg | SUBCUTANEOUS | 3 refills | Status: DC

## 2022-10-31 NOTE — Progress Notes (Signed)
Endocrinology Follow Up Note       10/31/2022, 4:22 PM   Subjective:    Patient ID: Monique Foster, female    DOB: [birth year].  Monique Foster is being seen in follow up after being seen in consultation for management of currently uncontrolled symptomatic diabetes requested by  Estanislado Pandy, MD.   Past Medical History:  Diagnosis Date   Asthma    Diabetes mellitus without complication (HCC)    GERD (gastroesophageal reflux disease)     Past Surgical History:  Procedure Laterality Date   CESAREAN SECTION     TUBAL LIGATION      Social History   Socioeconomic History   Marital status: Married    Spouse name: Not on file   Number of children: Not on file   Years of education: Not on file   Highest education level: Not on file  Occupational History   Not on file  Tobacco Use   Smoking status: Former   Smokeless tobacco: Never  Vaping Use   Vaping Use: Never used  Substance and Sexual Activity   Alcohol use: No   Drug use: No   Sexual activity: Not on file  Other Topics Concern   Not on file  Social History Narrative   Not on file   Social Determinants of Health   Financial Resource Strain: Not on file  Food Insecurity: Not on file  Transportation Needs: Not on file  Physical Activity: Not on file  Stress: Not on file  Social Connections: Not on file    Family History  Problem Relation Age of Onset   Diabetes Mother    Osteoporosis Mother    Diabetes Father    Heart attack Father     Outpatient Encounter Medications as of 10/31/2022  Medication Sig   atorvastatin (LIPITOR) 10 MG tablet Take 10 mg by mouth daily.   cetirizine (ZYRTEC) 10 MG tablet Take 10 mg by mouth daily.   Continuous Blood Gluc Sensor (FREESTYLE LIBRE 3 SENSOR) MISC Place 1 sensor on the skin every 14 days. Use to check glucose continuously   esomeprazole (NEXIUM) 20 MG capsule Take 20 mg by mouth daily.     insulin glargine, 2 Unit Dial, (TOUJEO MAX SOLOSTAR) 300 UNIT/ML Solostar Pen Inject 40 Units into the skin at bedtime.   Insulin Pen Needle (PEN NEEDLES) 31G X 5 MM MISC Use to inject insulin once daily   losartan (COZAAR) 25 MG tablet Take 25 mg by mouth daily.   metFORMIN (GLUCOPHAGE-XR) 500 MG 24 hr tablet Take 2 tablets (1,000 mg total) by mouth daily with breakfast.   Multiple Vitamin (MULTIVITAMIN) capsule Take 1 capsule by mouth daily.   ondansetron (ZOFRAN) 4 MG tablet Take 1 tablet (4 mg total) by mouth every 8 (eight) hours as needed for nausea or vomiting.   OVER THE COUNTER MEDICATION AMbren daily for hot flashes   RELION PEN NEEDLES 32G X 4 MM MISC SMARTSIG:Injection Daily   tirzepatide (MOUNJARO) 10 MG/0.5ML Pen Inject 10 mg into the skin once a week.   [DISCONTINUED] tirzepatide (MOUNJARO) 7.5 MG/0.5ML Pen Inject 7.5 mg into the skin once a week.   No facility-administered  encounter medications on file as of 10/31/2022.    ALLERGIES: Allergies  Allergen Reactions   Iodine Shortness Of Breath and Rash   Levofloxacin Swelling    Facial Area Facial Area   Shellfish Allergy Shortness Of Breath, Nausea And Vomiting and Rash   Shellfish-Derived Products Nausea And Vomiting, Rash and Shortness Of Breath   Latex Itching and Rash   Penicillins Rash    VACCINATION STATUS:  There is no immunization history on file for this patient.  Diabetes She presents for her follow-up diabetic visit. She has type 2 diabetes mellitus. Onset time: Diagnosed at approx age of 89. Her disease course has been improving. There are no hypoglycemic associated symptoms. Associated symptoms include weight loss. Pertinent negatives for diabetes include no fatigue. There are no hypoglycemic complications. Symptoms are stable. There are no diabetic complications. Risk factors for coronary artery disease include diabetes mellitus, dyslipidemia, obesity and sedentary lifestyle. Current diabetic treatment  includes oral agent (triple therapy), insulin injections and oral agent (dual therapy) (and Mounjaro). She is compliant with treatment most of the time. Her weight is decreasing steadily. She is following a generally unhealthy diet. When asked about meal planning, she reported none. She has not had a previous visit with a dietitian. She participates in exercise intermittently. Her home blood glucose trend is decreasing steadily. Her breakfast blood glucose range is generally 140-180 mg/dl. Her bedtime blood glucose range is generally 140-180 mg/dl. (She presents today with her CGM showing improved, near target glycemic profile overall.  Her POCT A1c today is 7.3%, improving from last visit of 8%.  She has lost more weight since last visit and is feeling much better.  Analysis of her CGM shows TIR 53%, TAR 47%, TBR 0% with a GMI of 7.6%.  She has cut way back on her soda consumption.) An ACE inhibitor/angiotensin II receptor blocker is being taken. She does not see a podiatrist.Eye exam is current.  Hyperlipidemia This is a chronic problem. The current episode started more than 1 year ago. The problem is uncontrolled. Recent lipid tests were reviewed and are variable. Exacerbating diseases include diabetes and obesity. Factors aggravating her hyperlipidemia include fatty foods. Current antihyperlipidemic treatment includes statins. The current treatment provides mild improvement of lipids. Compliance problems include adherence to diet and adherence to exercise.  Risk factors for coronary artery disease include diabetes mellitus, dyslipidemia, obesity and hypertension.  Hypertension This is a chronic problem. The current episode started more than 1 year ago. The problem is unchanged. The problem is controlled. There are no associated agents to hypertension. Risk factors for coronary artery disease include diabetes mellitus, dyslipidemia and obesity. Past treatments include angiotensin blockers. Compliance problems  include diet and exercise.  Identifiable causes of hypertension include sleep apnea.    Review of systems  Constitutional: + steadily decreasing body weight,  current Body mass index is 36.94 kg/m. , no fatigue, no subjective hyperthermia, no subjective hypothermia Eyes: no blurry vision, no xerophthalmia ENT: no sore throat, no nodules palpated in throat, no dysphagia/odynophagia, no hoarseness Cardiovascular: no chest pain, no shortness of breath, no palpitations, no leg swelling Respiratory: no cough, no shortness of breath Gastrointestinal: + mild nausea, no vomiting/diarrhea Musculoskeletal: no muscle/joint aches Skin: no rashes, no hyperemia Neurological: no tremors, no numbness, no tingling, no dizziness Psychiatric: no depression, no anxiety   Objective:     BP 97/65 (BP Location: Left Arm, Patient Position: Sitting, Cuff Size: Normal)   Pulse 73   Ht 5\' 4"  (1.626 m)  Wt 215 lb 3.2 oz (97.6 kg)   BMI 36.94 kg/m   Wt Readings from Last 3 Encounters:  10/31/22 215 lb 3.2 oz (97.6 kg)  07/25/22 228 lb (103.4 kg)  07/18/22 227 lb (103 kg)     BP Readings from Last 3 Encounters:  10/31/22 97/65  07/25/22 123/84  04/18/22 106/72      Physical Exam- Limited  Constitutional:  Body mass index is 36.94 kg/m. , not in acute distress, normal state of mind Eyes:  EOMI, no exophthalmos Neck: Supple Cardiovascular: RRR, no murmurs, rubs, or gallops, no edema Respiratory: Adequate breathing efforts, no crackles, rales, rhonchi, or wheezing Musculoskeletal: no gross deformities, strength intact in all four extremities, no gross restriction of joint movements Skin:  no rashes, no hyperemia Neurological: no tremor with outstretched hands    Diabetic Foot Exam - Simple   No data filed     CMP ( most recent) CMP     Component Value Date/Time   NA 140 10/25/2022 0935   K 4.6 10/25/2022 0935   CL 101 10/25/2022 0935   CO2 27 10/25/2022 0935   GLUCOSE 158 (H)  10/25/2022 0935   GLUCOSE 141 (H) 04/15/2012 0247   BUN 11 10/25/2022 0935   CREATININE 0.63 10/25/2022 0935   CALCIUM 9.6 10/25/2022 0935   PROT 7.3 10/25/2022 0935   ALBUMIN 4.5 10/25/2022 0935   AST 24 10/25/2022 0935   ALT 32 10/25/2022 0935   ALKPHOS 87 10/25/2022 0935   BILITOT 0.5 10/25/2022 0935   GFRNONAA 116 08/17/2020 0000   GFRAA >90 04/15/2012 0247     Diabetic Labs (most recent): Lab Results  Component Value Date   HGBA1C 7.3 (A) 10/31/2022   HGBA1C 8.0 (A) 07/25/2022   HGBA1C 9.1 (A) 04/18/2022   MICROALBUR 10 mg /L 10/31/2022   MICROALBUR 30 10/18/2021     Lipid Panel ( most recent) Lipid Panel     Component Value Date/Time   CHOL 192 10/25/2022 0935   TRIG 414 (H) 10/25/2022 0935   HDL 42 10/25/2022 0935   CHOLHDL 4.6 (H) 10/25/2022 0935   LDLCALC 83 10/25/2022 0935   LABVLDL 67 (H) 10/25/2022 0935      Lab Results  Component Value Date   TSH 1.140 10/25/2022   TSH 1.330 10/16/2021   FREET4 1.09 10/25/2022   FREET4 1.17 10/16/2021           Assessment & Plan:   1) Type 2 diabetes mellitus without complication, without long-term current use of insulin (HCC)  She presents today with her CGM showing improved, near target glycemic profile overall.  Her POCT A1c today is 7.3%, improving from last visit of 8%.  She has lost more weight since last visit and is feeling much better.  Analysis of her CGM shows TIR 53%, TAR 47%, TBR 0% with a GMI of 7.6%.  She has cut way back on her soda consumption.  - Monique HollingsheadAngel L Frisbee has currently uncontrolled symptomatic type 2 DM since 46 years of age.  -Recent labs reviewed.  She has elevated LFTs, and triglycerides, may be due to fatty liver?  PCP following along for this.  - I had a long discussion with her about the progressive nature of diabetes and the pathology behind its complications. -her diabetes is not currently complicated but she remains at a high risk for more acute and chronic complications which  include CAD, CVA, CKD, retinopathy, and neuropathy. These are all discussed in detail with her.  -  Nutritional counseling repeated at each appointment due to patients tendency to fall back in to old habits.  - The patient admits there is a room for improvement in their diet and drink choices. -  Suggestion is made for the patient to avoid simple carbohydrates from their diet including Cakes, Sweet Desserts / Pastries, Ice Cream, Soda (diet and regular), Sweet Tea, Candies, Chips, Cookies, Sweet Pastries, Store Bought Juices, Alcohol in Excess of 1-2 drinks a day, Artificial Sweeteners, Coffee Creamer, and "Sugar-free" Products. This will help patient to have stable blood glucose profile and potentially avoid unintended weight gain.   - I encouraged the patient to switch to unprocessed or minimally processed complex starch and increased protein intake (animal or plant source), fruits, and vegetables.   - Patient is advised to stick to a routine mealtimes to eat 3 meals a day and avoid unnecessary snacks (to snack only to correct hypoglycemia).  - she will be scheduled with Norm Salt, RDN, CDE for diabetes education.  - I have approached her with the following individualized plan to manage  her diabetes and patient agrees:   -She is advised to continue her Toujeo 40 units SQ nightly and Metformin 1000 mg ER daily with breakfast.  Will increase her Mounjaro to 10 mg SQ weekly.  -She is encouraged to continue monitoring blood glucose at least twice daily (using her CGM), before breakfast and before bed, and to call the clinic if she has readings less than 70 or greater than 300 for 3 tests in a row.    - Specific targets for  A1c;  LDL, HDL,  and Triglycerides were discussed with the patient.  2) Blood Pressure /Hypertension:  her blood pressure is controlled to target.  She is advised to continue Losartan 25 mg po daily.  3) Lipids/Hyperlipidemia:    Her most recent lipid panel from  10/25/22 shows controlled LDL of 83 and elevated triglycerides of 414 (improving).  She is advised to continue Lipitor 10 mg po daily at bedtime.  Side effects and precautions discussed with her.  She is advised to avoid fried foods and butter.    4)  Weight/Diet:  her Body mass index is 36.94 kg/m.  -  clearly complicating her diabetes care.   she is a candidate for weight loss. I discussed with her the fact that loss of 5 - 10% of her  current body weight will have the most impact on her diabetes management.  Exercise, and detailed carbohydrates information provided  -  detailed on discharge instructions.  5) Vitamin D deficiency Her recent vitamin d level was 26.3.  I advised her to start OTC vitamin D3 5000 units daily as supplement.  6) Chronic Care/Health Maintenance: -she is on Statin medications and is encouraged to initiate and continue to follow up with Ophthalmology, Dentist, Podiatrist at least yearly or according to recommendations, and advised to stay away from smoking. I have recommended yearly flu vaccine and pneumonia vaccine at least every 5 years; moderate intensity exercise for up to 150 minutes weekly; and sleep for at least 7 hours a day.  - she is advised to maintain close follow up with Sasser, Clarene Critchley, MD for primary care needs, as well as her other providers for optimal and coordinated care.      I spent 40 minutes in the care of the patient today including review of labs from CMP, Lipids, Thyroid Function, Hematology (current and previous including abstractions from other facilities); face-to-face time discussing  her blood glucose readings/logs, discussing hypoglycemia and hyperglycemia episodes and symptoms, medications doses, her options of short and long term treatment based on the latest standards of care / guidelines;  discussion about incorporating lifestyle medicine;  and documenting the encounter. Risk reduction counseling performed per USPSTF guidelines to reduce  obesity and cardiovascular risk factors.     Please refer to Patient Instructions for Blood Glucose Monitoring and Insulin/Medications Dosing Guide"  in media tab for additional information. Please  also refer to " Patient Self Inventory" in the Media  tab for reviewed elements of pertinent patient history.  Monique Foster participated in the discussions, expressed understanding, and voiced agreement with the above plans.  All questions were answered to her satisfaction. she is encouraged to contact clinic should she have any questions or concerns prior to her return visit.    Follow up plan: - Return in about 4 months (around 03/01/2023) for Diabetes F/U with A1c in office, No previsit labs, Bring meter and logs.   Ronny Bacon, [facility] [address] [place], [place] [address] Phone: [phone] Fax: [phone]  10/31/2022, 4:22 PM

## 2022-11-20 ENCOUNTER — Telehealth: Payer: Self-pay | Admitting: *Deleted

## 2022-11-20 ENCOUNTER — Other Ambulatory Visit: Payer: Self-pay | Admitting: Nurse Practitioner

## 2022-11-20 MED ORDER — ONDANSETRON HCL 4 MG PO TABS: 4.0000 mg | ORAL_TABLET | Freq: Three times a day (TID) | ORAL | 0 refills | Status: DC | PRN

## 2022-11-20 NOTE — Telephone Encounter (Signed)
I sent it in 

## 2022-11-20 NOTE — Telephone Encounter (Signed)
Patient called and requested a refill on her Zofran. She also request that it be sent to the [facility] in [place].

## 2022-11-20 NOTE — Telephone Encounter (Signed)
Patient was called and made aware. 

## 2023-01-14 ENCOUNTER — Other Ambulatory Visit: Payer: Self-pay | Admitting: Nurse Practitioner

## 2023-02-07 ENCOUNTER — Other Ambulatory Visit: Payer: Self-pay

## 2023-02-07 ENCOUNTER — Emergency Department (HOSPITAL_COMMUNITY)
Admission: EM | Admit: 2023-02-07 | Discharge: 2023-02-07 | Disposition: A | Discharge status: Home / Self Care | Payer: BC Managed Care – PPO | Attending: Emergency Medicine | Admitting: Emergency Medicine

## 2023-02-07 ENCOUNTER — Emergency Department (HOSPITAL_COMMUNITY): Payer: BC Managed Care – PPO

## 2023-02-07 ENCOUNTER — Encounter (HOSPITAL_COMMUNITY): Payer: Self-pay

## 2023-02-07 DIAGNOSIS — Z1152 Encounter for screening for COVID-19: Secondary | ICD-10-CM | POA: Diagnosis not present

## 2023-02-07 DIAGNOSIS — R0602 Shortness of breath: Secondary | ICD-10-CM | POA: Diagnosis not present

## 2023-02-07 DIAGNOSIS — Z7984 Long term (current) use of oral hypoglycemic drugs: Secondary | ICD-10-CM | POA: Insufficient documentation

## 2023-02-07 DIAGNOSIS — R0789 Other chest pain: Secondary | ICD-10-CM | POA: Diagnosis not present

## 2023-02-07 DIAGNOSIS — J4521 Mild intermittent asthma with (acute) exacerbation: Secondary | ICD-10-CM | POA: Insufficient documentation

## 2023-02-07 DIAGNOSIS — Z91040 Latex allergy status: Secondary | ICD-10-CM | POA: Insufficient documentation

## 2023-02-07 DIAGNOSIS — E119 Type 2 diabetes mellitus without complications: Secondary | ICD-10-CM | POA: Diagnosis not present

## 2023-02-07 DIAGNOSIS — Z794 Long term (current) use of insulin: Secondary | ICD-10-CM | POA: Insufficient documentation

## 2023-02-07 LAB — CBC WITH DIFFERENTIAL/PLATELET
Abs Immature Granulocytes: 0.02 10*3/uL (ref 0.00–0.07)
Basophils Absolute: 0 10*3/uL (ref 0.0–0.1)
Basophils Relative: 1 %
Eosinophils Absolute: 0.2 10*3/uL (ref 0.0–0.5)
Eosinophils Relative: 4 %
HCT: 38.7 % (ref 36.0–46.0)
Hemoglobin: 13.1 g/dL (ref 12.0–15.0)
Immature Granulocytes: 0 %
Lymphocytes Relative: 35 %
Lymphs Abs: 2.2 10*3/uL (ref 0.7–4.0)
MCH: 28.9 pg (ref 26.0–34.0)
MCHC: 33.9 g/dL (ref 30.0–36.0)
MCV: 85.4 fL (ref 80.0–100.0)
Monocytes Absolute: 0.5 10*3/uL (ref 0.1–1.0)
Monocytes Relative: 8 %
Neutro Abs: 3.3 10*3/uL (ref 1.7–7.7)
Neutrophils Relative %: 52 %
Platelets: 150 10*3/uL (ref 150–400)
RBC: 4.53 MIL/uL (ref 3.87–5.11)
RDW: 11.9 % (ref 11.5–15.5)
WBC: 6.3 10*3/uL (ref 4.0–10.5)
nRBC: 0 % (ref 0.0–0.2)

## 2023-02-07 LAB — BASIC METABOLIC PANEL
Anion gap: 9 (ref 5–15)
BUN: 12 mg/dL (ref 6–20)
CO2: 27 mmol/L (ref 22–32)
Calcium: 8.5 mg/dL — ABNORMAL LOW (ref 8.9–10.3)
Chloride: 99 mmol/L (ref 98–111)
Creatinine, Ser: 0.56 mg/dL (ref 0.44–1.00)
GFR, Estimated: >60 mL/min (ref >60)
Glucose, Bld: 207 mg/dL — ABNORMAL HIGH (ref 70–99)
Potassium: 3.8 mmol/L (ref 3.5–5.1)
Sodium: 135 mmol/L (ref 135–145)

## 2023-02-07 LAB — RESP PANEL BY RT-PCR (RSV, FLU A&B, COVID)  RVPGX2
Influenza A by PCR: NEGATIVE
Influenza B by PCR: NEGATIVE
Resp Syncytial Virus by PCR: NEGATIVE
SARS Coronavirus 2 by RT PCR: NEGATIVE

## 2023-02-07 LAB — D-DIMER, QUANTITATIVE: D-Dimer, Quant: 0.28 ug/mL-FEU (ref 0.00–0.50)

## 2023-02-07 LAB — BRAIN NATRIURETIC PEPTIDE: B Natriuretic Peptide: 65 pg/mL (ref 0.0–100.0)

## 2023-02-07 MED ORDER — AEROCHAMBER Z-STAT PLUS/MEDIUM MISC
1.0000 | Freq: Once | Status: AC
  Administered 2023-02-07: 1

## 2023-02-07 MED ORDER — ALBUTEROL SULFATE (2.5 MG/3ML) 0.083% IN NEBU
2.5000 mg | INHALATION_SOLUTION | Freq: Once | RESPIRATORY_TRACT | Status: AC
  Administered 2023-02-07: 2.5 mg via RESPIRATORY_TRACT
  Filled 2023-02-07: qty 3

## 2023-02-07 MED ORDER — PREDNISONE 50 MG PO TABS: 50.0000 mg | ORAL_TABLET | Freq: Every day | ORAL | 0 refills | Status: DC

## 2023-02-07 MED ORDER — IPRATROPIUM-ALBUTEROL 0.5-2.5 (3) MG/3ML IN SOLN
3.0000 mL | Freq: Once | RESPIRATORY_TRACT | Status: AC
  Administered 2023-02-07: 3 mL via RESPIRATORY_TRACT
  Filled 2023-02-07: qty 3

## 2023-02-07 MED ORDER — METHYLPREDNISOLONE SODIUM SUCC 125 MG IJ SOLR
125.0000 mg | Freq: Once | INTRAMUSCULAR | Status: AC
  Administered 2023-02-07: 125 mg via INTRAVENOUS
  Filled 2023-02-07: qty 2

## 2023-02-07 NOTE — ED Notes (Signed)
ED Provider at bedside. 

## 2023-02-07 NOTE — ED Notes (Signed)
RT at bedside.

## 2023-02-07 NOTE — ED Provider Notes (Signed)
[facility] Provider Note   CSN: [id number] Arrival date & time: 02/07/23  1831     History  Chief Complaint  Patient presents with   Asthma    Monique Foster is a 47 y.o. female.  Pt is a 47 yo female with pmhx significant for asthma, hld, gerd, and dm.  Pt has been sob for the past 2-3 days.  She coughed up a little blood today.  She took a home covid test and it was neg.  Pt feels some tightness in her chest.  No fever.       Home Medications Prior to Admission medications   Medication Sig Start Date End Date Taking? Authorizing Provider  albuterol (VENTOLIN HFA) 108 (90 Base) MCG/ACT inhaler Inhale 2 puffs into the lungs every 4 (four) hours as needed for wheezing or shortness of breath.   Yes [provider]  atorvastatin (LIPITOR) 10 MG tablet Take 10 mg by mouth daily. 03/19/21  Yes [provider]  cetirizine (ZYRTEC) 10 MG tablet Take 10 mg by mouth daily.   Yes [provider]  esomeprazole (NEXIUM) 20 MG capsule Take 20 mg by mouth daily.    Yes [provider]  insulin glargine, 2 Unit Dial, (TOUJEO MAX SOLOSTAR) 300 UNIT/ML Solostar Pen Inject 40 Units into the skin at bedtime. 01/18/22  Yes [name], [name], NP  losartan (COZAAR) 25 MG tablet Take 25 mg by mouth daily. 03/20/21  Yes [provider]  metFORMIN (GLUCOPHAGE-XR) 500 MG 24 hr tablet Take 2 tablets (1,000 mg total) by mouth daily with breakfast. 07/25/22  Yes Brita Romp, NP  Multiple Vitamin (MULTIVITAMIN) capsule Take 1 capsule by mouth daily.   Yes [provider]  ondansetron (ZOFRAN) 4 MG tablet Take 1 tablet (4 mg total) by mouth every 8 (eight) hours as needed for nausea or vomiting. 11/20/22  Yes [name], [name], NP  OVER THE COUNTER MEDICATION AMbren daily for hot flashes   Yes [provider]  predniSONE (DELTASONE) 50 MG tablet Take 1 tablet (50 mg total) by mouth daily with  breakfast. 02/07/23  Yes [name], MD  tirzepatide [facility]) 10 MG/0.5ML Pen Inject 10 mg into the skin once a week. 10/31/22  Yes [name], [name], NP  Continuous Blood Gluc Sensor (FREESTYLE LIBRE 3 SENSOR) MISC PLACE 1 SENSOR ON THE SKIN EVERY 14 DAYS. USE TO CHECK GLUCOSE CONTINUOUSLY 01/14/23   Brita Romp, NP  Insulin Pen Needle (PEN NEEDLES) 31G X 5 MM MISC Use to inject insulin once daily 10/18/21   Brita Romp, NP  RELION PEN NEEDLES 32G X 4 MM MISC SMARTSIG:Injection Daily 10/18/21   [provider]      Allergies    Iodine, Levofloxacin, Shellfish allergy, Shellfish-derived products, Latex, and Penicillins    Review of Systems   Review of Systems  Respiratory:  Positive for cough, shortness of breath and wheezing.   All other systems reviewed and are negative.   Physical Exam Updated Vital Signs BP 116/68 (BP Location: Right Arm)   Pulse 96   Temp 98 F (36.7 C) (Oral)   Resp 18   Ht 5' 4"$  (1.626 m)   Wt 97.5 kg   SpO2 97%   BMI 36.90 kg/m  Physical Exam Vitals and nursing note reviewed.  Constitutional:      Appearance: Normal appearance. She is obese.  HENT:     Head: Normocephalic and atraumatic.  Right Ear: External ear normal.     Left Ear: External ear normal.     Nose: Nose normal.     Mouth/Throat:     Mouth: Mucous membranes are moist.     Pharynx: Oropharynx is clear.  Eyes:     Extraocular Movements: Extraocular movements intact.     Conjunctiva/sclera: Conjunctivae normal.     Pupils: Pupils are equal, round, and reactive to light.  Cardiovascular:     Rate and Rhythm: Normal rate and regular rhythm.     Pulses: Normal pulses.     Heart sounds: Normal heart sounds.  Pulmonary:     Effort: Pulmonary effort is normal.     Breath sounds: Wheezing present.  Abdominal:     General: Abdomen is flat. Bowel sounds are normal.     Palpations: Abdomen is soft.  Musculoskeletal:        General: Normal range of motion.      Cervical back: Normal range of motion and neck supple.  Skin:    General: Skin is warm.     Capillary Refill: Capillary refill takes less than 2 seconds.  Neurological:     General: No focal deficit present.     Mental Status: She is alert and oriented to person, place, and time.  Psychiatric:        Mood and Affect: Mood normal.        Behavior: Behavior normal.     ED Results / Procedures / Treatments   Labs (all labs ordered are listed, but only abnormal results are displayed) Labs Reviewed  BASIC METABOLIC PANEL - Abnormal; Notable for the following components:      Result Value   Glucose, Bld 207 (*)    Calcium 8.5 (*)    All other components within normal limits  RESP PANEL BY RT-PCR (RSV, FLU A&B, COVID)  [id number]  BRAIN NATRIURETIC PEPTIDE  CBC WITH DIFFERENTIAL/PLATELET  D-DIMER, QUANTITATIVE    EKG EKG Interpretation  Date/Time:  Thursday February 07 2023 18:43:15 EST Ventricular Rate:  87 PR Interval:  190 QRS Duration: 94 QT Interval:  364 QTC Calculation: 438 R Axis:   50 Text Interpretation: Sinus rhythm RSR' in V1 or V2, probably normal variant No old tracing to compare Confirmed by [name] [phone]) on 02/07/2023 7:09:57 PM  Radiology DG Chest Port 1 View  Result Date: 02/07/2023 CLINICAL DATA:  Chest tightness. EXAM: PORTABLE CHEST 1 VIEW COMPARISON:  None Available. FINDINGS: The heart size and mediastinal contours are within normal limits. Both lungs are clear. The visualized skeletal structures are unremarkable. IMPRESSION: No active disease. Electronically Signed   By: Virgina Norfolk M.D.   On: 02/07/2023 19:29    Procedures Procedures    Medications Ordered in ED Medications  ipratropium-albuterol (DUONEB) 0.5-2.5 (3) MG/3ML nebulizer solution 3 mL (3 mLs Nebulization Given 02/07/23 1922)  methylPREDNISolone sodium succinate (SOLU-MEDROL) 125 mg/2 mL injection 125 mg (125 mg Intravenous Given 02/07/23 1911)  albuterol (PROVENTIL)  (2.5 MG/3ML) 0.083% nebulizer solution 2.5 mg (2.5 mg Nebulization Given 02/07/23 2104)  aerochamber Z-Stat Plus/medium 1 each (1 each Other Given 02/07/23 2118)    ED Course/ Medical Decision Making/ A&P                             Medical Decision Making Amount and/or Complexity of Data Reviewed Labs: ordered. Radiology: ordered.  Risk Prescription drug management.   This patient presents to the ED for  concern of sob, this involves an extensive number of treatment options, and is a complaint that carries with it a high risk of complications and morbidity.  The differential diagnosis includes asthma exac, bronchitis, pna, covid/flu/rsv   Co morbidities that complicate the patient evaluation  asthma, hld, gerd, and dm   Additional history obtained:  Additional history obtained from epic chart review     Lab Tests:  I Ordered, and personally interpreted labs.  The pertinent results include:  ddime rneg, cbc nl, bmp with glucose 207, bnp 65; covid/flu/rsv neg   Imaging Studies ordered:  I ordered imaging studies including cxr  I independently visualized and interpreted imaging which showed No active disease.  I agree with the radiologist interpretation   Cardiac Monitoring:  The patient was maintained on a cardiac monitor.  I personally viewed and interpreted the cardiac monitored which showed an underlying rhythm of: nsr   Medicines ordered and prescription drug management:  I ordered medication including solumedrol and nebs  for sob  Reevaluation of the patient after these medicines showed that the patient improved I have reviewed the patients home medicines and have made adjustments as needed  Critical Interventions:  nebs   Problem List / ED Course:  Asthma exac:  pt is feeling much better after nebs and steroids.  She has an inhaler, but no spacer.  She is given a spacer.  She will be d/c with steroids.  She knows her sugars will go up.  She is  instructed to keep a close eye on them.  She is to return if worse.  F/u with pcp.   Reevaluation:  After the interventions noted above, I reevaluated the patient and found that they have :improved   Social Determinants of Health:  Lives at home   Dispostion:  After consideration of the diagnostic results and the patients response to treatment, I feel that the patent would benefit from discharge with outpatient f/u.          Final Clinical Impression(s) / ED Diagnoses Final diagnoses:  Mild intermittent asthma with exacerbation    Rx / DC Orders ED Discharge Orders          Ordered    predniSONE (DELTASONE) 50 MG tablet  Daily with breakfast        02/07/23 2129              [name], MD 02/07/23 2244

## 2023-02-07 NOTE — ED Triage Notes (Signed)
Patient reports having chest tightness due to her asthma and wheezing.  Patient reports she has a cough and coughed up a little sputum that had some blood in it.  Denies actual chest pain and states "its not my heart".

## 2023-03-04 ENCOUNTER — Ambulatory Visit (INDEPENDENT_AMBULATORY_CARE_PROVIDER_SITE_OTHER): Payer: BC Managed Care – PPO | Admitting: Nurse Practitioner

## 2023-03-04 ENCOUNTER — Encounter: Payer: Self-pay | Admitting: Nurse Practitioner

## 2023-03-04 VITALS — BP 110/76 | HR 77 | Ht 64.0 in | Wt 212.2 lb

## 2023-03-04 DIAGNOSIS — E119 Type 2 diabetes mellitus without complications: Secondary | ICD-10-CM

## 2023-03-04 DIAGNOSIS — I10 Essential (primary) hypertension: Secondary | ICD-10-CM | POA: Diagnosis not present

## 2023-03-04 DIAGNOSIS — E782 Mixed hyperlipidemia: Secondary | ICD-10-CM

## 2023-03-04 LAB — POCT GLYCOSYLATED HEMOGLOBIN (HGB A1C): Hemoglobin A1C: 8 % — AB (ref 4.0–5.6)

## 2023-03-04 MED ORDER — TIRZEPATIDE 10 MG/0.5ML ~~LOC~~ SOAJ: 10.0000 mg | SUBCUTANEOUS | 3 refills | Status: DC

## 2023-03-04 MED ORDER — FREESTYLE LIBRE 3 SENSOR MISC: 3 refills | Status: DC

## 2023-03-04 MED ORDER — TOUJEO MAX SOLOSTAR 300 UNIT/ML ~~LOC~~ SOPN: 50.0000 [IU] | PEN_INJECTOR | Freq: Every evening | SUBCUTANEOUS | 3 refills | Status: DC

## 2023-03-04 MED ORDER — METFORMIN HCL ER 500 MG PO TB24: 1000.0000 mg | ORAL_TABLET | Freq: Every day | ORAL | 3 refills | Status: DC

## 2023-03-04 NOTE — Progress Notes (Signed)
Endocrinology Follow Up Note       03/04/2023, 3:03 PM   Subjective:    Patient ID: Monique Foster, female    DOB: [birth year].  Monique Foster is being seen in follow up after being seen in consultation for management of currently uncontrolled symptomatic diabetes requested by  Manon Hilding, MD.   Past Medical History:  Diagnosis Date   Asthma    Diabetes mellitus without complication ([place])    GERD (gastroesophageal reflux disease)     Past Surgical History:  Procedure Laterality Date   CESAREAN SECTION     TUBAL LIGATION      Social History   Socioeconomic History   Marital status: Married    Spouse name: Not on file   Number of children: Not on file   Years of education: Not on file   Highest education level: Not on file  Occupational History   Not on file  Tobacco Use   Smoking status: Former   Smokeless tobacco: Never  Vaping Use   Vaping Use: Never used  Substance and Sexual Activity   Alcohol use: No   Drug use: No   Sexual activity: Not on file  Other Topics Concern   Not on file  Social History Narrative   Not on file   Social Determinants of Health   Financial Resource Strain: Not on file  Food Insecurity: Not on file  Transportation Needs: Not on file  Physical Activity: Not on file  Stress: Not on file  Social Connections: Not on file    Family History  Problem Relation Age of Onset   Diabetes Mother    Osteoporosis Mother    Diabetes Father    Heart attack Father     Outpatient Encounter Medications as of 03/04/2023  Medication Sig   albuterol (VENTOLIN HFA) 108 (90 Base) MCG/ACT inhaler Inhale 2 puffs into the lungs every 4 (four) hours as needed for wheezing or shortness of breath.   atorvastatin (LIPITOR) 10 MG tablet Take 10 mg by mouth daily.   cetirizine (ZYRTEC) 10 MG tablet Take 10 mg by mouth daily.   esomeprazole (NEXIUM) 20 MG capsule Take 20 mg by mouth  daily.    Insulin Pen Needle (PEN NEEDLES) 31G X 5 MM MISC Use to inject insulin once daily   losartan (COZAAR) 25 MG tablet Take 25 mg by mouth daily.   Multiple Vitamin (MULTIVITAMIN) capsule Take 1 capsule by mouth daily.   ondansetron (ZOFRAN) 4 MG tablet Take 1 tablet (4 mg total) by mouth every 8 (eight) hours as needed for nausea or vomiting.   OVER THE COUNTER MEDICATION AMbren daily for hot flashes   RELION PEN NEEDLES 32G X 4 MM MISC SMARTSIG:Injection Daily   [DISCONTINUED] Continuous Blood Gluc Sensor (FREESTYLE LIBRE 3 SENSOR) MISC PLACE 1 SENSOR ON THE SKIN EVERY 14 DAYS. USE TO CHECK GLUCOSE CONTINUOUSLY   [DISCONTINUED] insulin glargine, 2 Unit Dial, (TOUJEO MAX SOLOSTAR) 300 UNIT/ML Solostar Pen Inject 40 Units into the skin at bedtime.   [DISCONTINUED] metFORMIN (GLUCOPHAGE-XR) 500 MG 24 hr tablet Take 2 tablets (1,000 mg total) by mouth daily with breakfast.   [DISCONTINUED] tirzepatide (MOUNJARO) 10  MG/0.5ML Pen Inject 10 mg into the skin once a week.   Continuous Blood Gluc Sensor (FREESTYLE LIBRE 3 SENSOR) MISC Change sensor every 14 days.   insulin glargine, 2 Unit Dial, (TOUJEO MAX SOLOSTAR) 300 UNIT/ML Solostar Pen Inject 50 Units into the skin at bedtime.   metFORMIN (GLUCOPHAGE-XR) 500 MG 24 hr tablet Take 2 tablets (1,000 mg total) by mouth daily with breakfast.   tirzepatide (MOUNJARO) 10 MG/0.5ML Pen Inject 10 mg into the skin once a week.   [DISCONTINUED] predniSONE (DELTASONE) 50 MG tablet Take 1 tablet (50 mg total) by mouth daily with breakfast. (Patient not taking: Reported on 03/04/2023)   No facility-administered encounter medications on file as of 03/04/2023.    ALLERGIES: Allergies  Allergen Reactions   Iodine Shortness Of Breath and Rash   Levofloxacin Swelling    Facial Area Facial Area   Shellfish Allergy Shortness Of Breath, Nausea And Vomiting and Rash   Shellfish-Derived Products Nausea And Vomiting, Rash and Shortness Of Breath   Latex Itching  and Rash   Penicillins Rash    VACCINATION STATUS:  There is no immunization history on file for this patient.  Diabetes She presents for her follow-up diabetic visit. She has type 2 diabetes mellitus. Onset time: Diagnosed at approx age of 47. Her disease course has been worsening. There are no hypoglycemic associated symptoms. Pertinent negatives for diabetes include no fatigue. There are no hypoglycemic complications. Symptoms are stable. There are no diabetic complications. Risk factors for coronary artery disease include diabetes mellitus, dyslipidemia, obesity and sedentary lifestyle. Current diabetic treatment includes oral agent (triple therapy), insulin injections and oral agent (dual therapy) (and Mounjaro). She is compliant with treatment most of the time. Her weight is decreasing steadily. She is following a generally unhealthy diet. When asked about meal planning, she reported none. She has not had a previous visit with a dietitian. She participates in exercise intermittently. Her home blood glucose trend is increasing steadily. Her breakfast blood glucose range is generally >200 mg/dl. Her bedtime blood glucose range is generally >200 mg/dl. Her overall blood glucose range is >200 mg/dl. (She presents today with her CGM showing above target glycemic profile overall.  Her POCT A1c today is 8%, increasing from last visit of 7.3%.  She reports she recently had an asthma exacerbation and was given prednisone which made her glucose spike over 400.  She notes nausea following Mounjaro injections but admits she hasn't eaten the healthiest either which may have contributed to her nausea.) An ACE inhibitor/angiotensin II receptor blocker is being taken. She does not see a podiatrist.Eye exam is current.  Hyperlipidemia This is a chronic problem. The current episode started more than 1 year ago. The problem is uncontrolled. Recent lipid tests were reviewed and are variable. Exacerbating diseases  include diabetes and obesity. Factors aggravating her hyperlipidemia include fatty foods. Current antihyperlipidemic treatment includes statins. The current treatment provides mild improvement of lipids. Compliance problems include adherence to diet and adherence to exercise.  Risk factors for coronary artery disease include diabetes mellitus, dyslipidemia, obesity and hypertension.  Hypertension This is a chronic problem. The current episode started more than 1 year ago. The problem is unchanged. The problem is controlled. There are no associated agents to hypertension. Risk factors for coronary artery disease include diabetes mellitus, dyslipidemia and obesity. Past treatments include angiotensin blockers. Compliance problems include diet and exercise.  Identifiable causes of hypertension include sleep apnea.    Review of systems  Constitutional: + steadily decreasing  body weight,  current Body mass index is 36.42 kg/m. , no fatigue, no subjective hyperthermia, no subjective hypothermia Eyes: no blurry vision, no xerophthalmia ENT: no sore throat, no nodules palpated in throat, no dysphagia/odynophagia, no hoarseness Cardiovascular: no chest pain, no shortness of breath, no palpitations, no leg swelling Respiratory: no cough, no shortness of breath Gastrointestinal: + mild nausea right after Mounjaro injection, no vomiting/diarrhea Musculoskeletal: no muscle/joint aches Skin: no rashes, no hyperemia Neurological: no tremors, no numbness, no tingling, no dizziness Psychiatric: no depression, no anxiety   Objective:     BP 110/76 (BP Location: Right Arm, Patient Position: Sitting, Cuff Size: Large)   Pulse 77   Ht '5\' 4"'$  (1.626 m)   Wt 212 lb 3.2 oz (96.3 kg)   BMI 36.42 kg/m   Wt Readings from Last 3 Encounters:  03/04/23 212 lb 3.2 oz (96.3 kg)  02/07/23 215 lb (97.5 kg)  10/31/22 215 lb 3.2 oz (97.6 kg)     BP Readings from Last 3 Encounters:  03/04/23 110/76  02/07/23 116/68   10/31/22 97/65      Physical Exam- Limited  Constitutional:  Body mass index is 36.42 kg/m. , not in acute distress, normal state of mind Eyes:  EOMI, no exophthalmos Musculoskeletal: no gross deformities, strength intact in all four extremities, no gross restriction of joint movements Skin:  no rashes, no hyperemia Neurological: no tremor with outstretched hands    Diabetic Foot Exam - Simple   No data filed     CMP ( most recent) CMP     Component Value Date/Time   NA 135 02/07/2023 1912   NA 140 10/25/2022 0935   K 3.8 02/07/2023 1912   CL 99 02/07/2023 1912   CO2 27 02/07/2023 1912   GLUCOSE 207 (H) 02/07/2023 1912   BUN 12 02/07/2023 1912   BUN 11 10/25/2022 0935   CREATININE 0.56 02/07/2023 1912   CALCIUM 8.5 (L) 02/07/2023 1912   PROT 7.3 10/25/2022 0935   ALBUMIN 4.5 10/25/2022 0935   AST 24 10/25/2022 0935   ALT 32 10/25/2022 0935   ALKPHOS 87 10/25/2022 0935   BILITOT 0.5 10/25/2022 0935   GFRNONAA >60 02/07/2023 1912   GFRAA >90 04/15/2012 0247     Diabetic Labs (most recent): Lab Results  Component Value Date   HGBA1C 8.0 (A) 03/04/2023   HGBA1C 7.3 (A) 10/31/2022   HGBA1C 8.0 (A) 07/25/2022   MICROALBUR 10 mg /L 10/31/2022   MICROALBUR 30 10/18/2021     Lipid Panel ( most recent) Lipid Panel     Component Value Date/Time   CHOL 192 10/25/2022 0935   TRIG 414 (H) 10/25/2022 0935   HDL 42 10/25/2022 0935   CHOLHDL 4.6 (H) 10/25/2022 0935   LDLCALC 83 10/25/2022 0935   LABVLDL 67 (H) 10/25/2022 0935      Lab Results  Component Value Date   TSH 1.140 10/25/2022   TSH 1.330 10/16/2021   FREET4 1.09 10/25/2022   FREET4 1.17 10/16/2021           Assessment & Plan:   1) Type 2 diabetes mellitus without complication, without long-term current use of insulin ([place])  She presents today with her CGM showing above target glycemic profile overall.  Her POCT A1c today is 8%, increasing from last visit of 7.3%.  She reports she  recently had an asthma exacerbation and was given prednisone which made her glucose spike over 400.  She notes nausea following Mounjaro injections but admits  she hasn't eaten the healthiest either which may have contributed to her nausea.  - Monique Foster has currently uncontrolled symptomatic type 2 DM since 47 years of age.  -Recent labs reviewed.    - I had a long discussion with her about the progressive nature of diabetes and the pathology behind its complications. -her diabetes is not currently complicated but she remains at a high risk for more acute and chronic complications which include CAD, CVA, CKD, retinopathy, and neuropathy. These are all discussed in detail with her.  The following Lifestyle Medicine recommendations according to [facility]) were discussed and offered to patient and she agrees to start the journey:  A. Whole Foods, Plant-based plate comprising of fruits and vegetables, plant-based proteins, whole-grain carbohydrates was discussed in detail with the patient.   A list for source of those nutrients were also provided to the patient.  Patient will use only water or unsweetened tea for hydration. B.  The need to stay away from risky substances including alcohol, smoking; obtaining 7 to 9 hours of restorative sleep, at least 150 minutes of moderate intensity exercise weekly, the importance of healthy social connections,  and stress reduction techniques were discussed. C.  A full color page of  Calorie density of various food groups per pound showing examples of each food groups was provided to the patient.  - Nutritional counseling repeated at each appointment due to patients tendency to fall back in to old habits.  - The patient admits there is a room for improvement in their diet and drink choices. -  Suggestion is made for the patient to avoid simple carbohydrates from their diet including Cakes, Sweet Desserts / Pastries, Ice Cream,  Soda (diet and regular), Sweet Tea, Candies, Chips, Cookies, Sweet Pastries, Store Bought Juices, Alcohol in Excess of 1-2 drinks a day, Artificial Sweeteners, Coffee Creamer, and "Sugar-free" Products. This will help patient to have stable blood glucose profile and potentially avoid unintended weight gain.   - I encouraged the patient to switch to unprocessed or minimally processed complex starch and increased protein intake (animal or plant source), fruits, and vegetables.   - Patient is advised to stick to a routine mealtimes to eat 3 meals a day and avoid unnecessary snacks (to snack only to correct hypoglycemia).  - she will be scheduled with Jearld Fenton, RDN, CDE for diabetes education.  - I have approached her with the following individualized plan to manage  her diabetes and patient agrees:   -She is advised to increase her Toujeo to 50 units SQ nightly, continue Metformin 1000 mg ER daily with breakfast, and Mounjaro 10 mg SQ weekly.  -She is encouraged to continue monitoring blood glucose at least twice daily (using her CGM), before breakfast and before bed, and to call the clinic if she has readings less than 70 or greater than 300 for 3 tests in a row.    - Specific targets for  A1c;  LDL, HDL,  and Triglycerides were discussed with the patient.  2) Blood Pressure /Hypertension:  her blood pressure is controlled to target.  She is advised to continue Losartan 25 mg po daily.  3) Lipids/Hyperlipidemia:    Her most recent lipid panel from 10/25/22 shows controlled LDL of 83 and elevated triglycerides of 414 (improving).  She is advised to continue Lipitor 10 mg po daily at bedtime.  Side effects and precautions discussed with her.  She is advised to avoid fried foods and butter.  4)  Weight/Diet:  her Body mass index is 36.42 kg/m.  -  clearly complicating her diabetes care.   she is a candidate for weight loss. I discussed with her the fact that loss of 5 - 10% of her  current  body weight will have the most impact on her diabetes management.  Exercise, and detailed carbohydrates information provided  -  detailed on discharge instructions.  5) Vitamin D deficiency Her recent vitamin d level was 26.3.  I advised her to start OTC vitamin D3 5000 units daily as supplement.  6) Chronic Care/Health Maintenance: -she is on Statin medications and is encouraged to initiate and continue to follow up with Ophthalmology, Dentist, Podiatrist at least yearly or according to recommendations, and advised to stay away from smoking. I have recommended yearly flu vaccine and pneumonia vaccine at least every 5 years; moderate intensity exercise for up to 150 minutes weekly; and sleep for at least 7 hours a day.  - she is advised to maintain close follow up with Sasser, Silvestre Moment, MD for primary care needs, as well as her other providers for optimal and coordinated care.      I spent  30  minutes in the care of the patient today including review of labs from [place], Lipids, Thyroid Function, Hematology (current and previous including abstractions from other facilities); face-to-face time discussing  her blood glucose readings/logs, discussing hypoglycemia and hyperglycemia episodes and symptoms, medications doses, her options of short and long term treatment based on the latest standards of care / guidelines;  discussion about incorporating lifestyle medicine;  and documenting the encounter. Risk reduction counseling performed per USPSTF guidelines to reduce obesity and cardiovascular risk factors.     Please refer to Patient Instructions for Blood Glucose Monitoring and Insulin/Medications Dosing Guide"  in media tab for additional information. Please  also refer to " Patient Self Inventory" in the Media  tab for reviewed elements of pertinent patient history.  Monique Foster participated in the discussions, expressed understanding, and voiced agreement with the above plans.  All questions were  answered to her satisfaction. she is encouraged to contact clinic should she have any questions or concerns prior to her return visit.    Follow up plan: - Return in about 3 months (around 06/04/2023) for Diabetes F/U with A1c in office, Bring meter and logs.   Rayetta Pigg, [facility] [address] [place], [place] [address] Phone: [phone] Fax: [phone]  03/04/2023, 3:03 PM

## 2023-04-10 DIAGNOSIS — K219 Gastro-esophageal reflux disease without esophagitis: Secondary | ICD-10-CM | POA: Diagnosis not present

## 2023-04-10 DIAGNOSIS — E782 Mixed hyperlipidemia: Secondary | ICD-10-CM | POA: Diagnosis not present

## 2023-04-10 DIAGNOSIS — E1140 Type 2 diabetes mellitus with diabetic neuropathy, unspecified: Secondary | ICD-10-CM | POA: Diagnosis not present

## 2023-04-10 DIAGNOSIS — E781 Pure hyperglyceridemia: Secondary | ICD-10-CM | POA: Diagnosis not present

## 2023-04-17 DIAGNOSIS — E1165 Type 2 diabetes mellitus with hyperglycemia: Secondary | ICD-10-CM | POA: Diagnosis not present

## 2023-04-17 DIAGNOSIS — E7849 Other hyperlipidemia: Secondary | ICD-10-CM | POA: Diagnosis not present

## 2023-04-17 DIAGNOSIS — Z1389 Encounter for screening for other disorder: Secondary | ICD-10-CM | POA: Diagnosis not present

## 2023-04-17 DIAGNOSIS — G4733 Obstructive sleep apnea (adult) (pediatric): Secondary | ICD-10-CM | POA: Diagnosis not present

## 2023-04-17 DIAGNOSIS — Z1331 Encounter for screening for depression: Secondary | ICD-10-CM | POA: Diagnosis not present

## 2023-06-13 ENCOUNTER — Encounter: Payer: Self-pay | Admitting: Nurse Practitioner

## 2023-06-13 ENCOUNTER — Ambulatory Visit (INDEPENDENT_AMBULATORY_CARE_PROVIDER_SITE_OTHER): Payer: BC Managed Care – PPO | Admitting: Nurse Practitioner

## 2023-06-13 VITALS — BP 102/68 | HR 83 | Ht 64.0 in | Wt 207.6 lb

## 2023-06-13 DIAGNOSIS — I10 Essential (primary) hypertension: Secondary | ICD-10-CM

## 2023-06-13 DIAGNOSIS — Z7984 Long term (current) use of oral hypoglycemic drugs: Secondary | ICD-10-CM | POA: Diagnosis not present

## 2023-06-13 DIAGNOSIS — E119 Type 2 diabetes mellitus without complications: Secondary | ICD-10-CM

## 2023-06-13 DIAGNOSIS — Z794 Long term (current) use of insulin: Secondary | ICD-10-CM | POA: Diagnosis not present

## 2023-06-13 DIAGNOSIS — Z7985 Long-term (current) use of injectable non-insulin antidiabetic drugs: Secondary | ICD-10-CM

## 2023-06-13 DIAGNOSIS — E782 Mixed hyperlipidemia: Secondary | ICD-10-CM

## 2023-06-13 LAB — POCT GLYCOSYLATED HEMOGLOBIN (HGB A1C): Hemoglobin A1C: 7.2 % — AB (ref 4.0–5.6)

## 2023-06-13 MED ORDER — TIRZEPATIDE 10 MG/0.5ML ~~LOC~~ SOAJ: 10.0000 mg | SUBCUTANEOUS | 3 refills | Status: DC

## 2023-06-13 MED ORDER — METFORMIN HCL ER 500 MG PO TB24: 1000.0000 mg | ORAL_TABLET | Freq: Every day | ORAL | 3 refills | Status: DC

## 2023-06-13 MED ORDER — FREESTYLE LIBRE 3 SENSOR MISC: 3 refills | Status: DC

## 2023-06-13 NOTE — Progress Notes (Signed)
Endocrinology Follow Up Note       06/13/2023, 4:20 PM   Subjective:    Patient ID: Monique Foster, female    DOB: 02-Aug-1976.  Monique Foster is being seen in follow up after being seen in consultation for management of currently uncontrolled symptomatic diabetes requested by  Estanislado Pandy, MD.   Past Medical History:  Diagnosis Date   Asthma    Diabetes mellitus without complication (HCC)    GERD (gastroesophageal reflux disease)     Past Surgical History:  Procedure Laterality Date   CESAREAN SECTION     TUBAL LIGATION      Social History   Socioeconomic History   Marital status: Married    Spouse name: Not on file   Number of children: Not on file   Years of education: Not on file   Highest education level: Not on file  Occupational History   Not on file  Tobacco Use   Smoking status: Former   Smokeless tobacco: Never  Vaping Use   Vaping Use: Never used  Substance and Sexual Activity   Alcohol use: No   Drug use: No   Sexual activity: Not on file  Other Topics Concern   Not on file  Social History Narrative   Not on file   Social Determinants of Health   Financial Resource Strain: Not on file  Food Insecurity: Not on file  Transportation Needs: Not on file  Physical Activity: Not on file  Stress: Not on file  Social Connections: Not on file    Family History  Problem Relation Age of Onset   Diabetes Mother    Osteoporosis Mother    Diabetes Father    Heart attack Father     Outpatient Encounter Medications as of 06/13/2023  Medication Sig   albuterol (VENTOLIN HFA) 108 (90 Base) MCG/ACT inhaler Inhale 2 puffs into the lungs every 4 (four) hours as needed for wheezing or shortness of breath.   cetirizine (ZYRTEC) 10 MG tablet Take 10 mg by mouth daily.   cetirizine (ZYRTEC) 10 MG tablet Take 10 mg by mouth 2 (two) times daily.   esomeprazole (NEXIUM) 20 MG capsule Take 20  mg by mouth daily.    esomeprazole (NEXIUM) 20 MG capsule Take 20 mg by mouth daily at 12 noon.   gemfibrozil (LOPID) 600 MG tablet Take 600 mg by mouth 2 (two) times daily.   Insulin Pen Needle (PEN NEEDLES) 31G X 5 MM MISC Use to inject insulin once daily   losartan (COZAAR) 25 MG tablet Take 25 mg by mouth daily.   Multiple Vitamin (MULTIVITAMIN) capsule Take 1 capsule by mouth daily.   ondansetron (ZOFRAN) 4 MG tablet Take 1 tablet (4 mg total) by mouth every 8 (eight) hours as needed for nausea or vomiting.   OVER THE COUNTER MEDICATION AMbren daily for hot flashes   RELION PEN NEEDLES 32G X 4 MM MISC SMARTSIG:Injection Daily   [DISCONTINUED] Continuous Blood Gluc Sensor (FREESTYLE LIBRE 3 SENSOR) MISC Change sensor every 14 days.   [DISCONTINUED] insulin glargine, 2 Unit Dial, (TOUJEO MAX SOLOSTAR) 300 UNIT/ML Solostar Pen Inject 50 Units into the skin at bedtime.   [  DISCONTINUED] metFORMIN (GLUCOPHAGE-XR) 500 MG 24 hr tablet Take 2 tablets (1,000 mg total) by mouth daily with breakfast.   [DISCONTINUED] tirzepatide (MOUNJARO) 10 MG/0.5ML Pen Inject 10 mg into the skin once a week.   atorvastatin (LIPITOR) 10 MG tablet Take 10 mg by mouth daily. (Patient not taking: Reported on 06/13/2023)   Continuous Glucose Sensor (FREESTYLE LIBRE 3 SENSOR) MISC Change sensor every 14 days.   metFORMIN (GLUCOPHAGE-XR) 500 MG 24 hr tablet Take 2 tablets (1,000 mg total) by mouth daily with breakfast.   tirzepatide (MOUNJARO) 10 MG/0.5ML Pen Inject 10 mg into the skin once a week.   No facility-administered encounter medications on file as of 06/13/2023.    ALLERGIES: Allergies  Allergen Reactions   Iodine Shortness Of Breath and Rash   Levofloxacin Swelling    Facial Area Facial Area   Shellfish Allergy Shortness Of Breath, Nausea And Vomiting and Rash   Shellfish-Derived Products Nausea And Vomiting, Rash and Shortness Of Breath   Latex Itching and Rash   Penicillins Rash    VACCINATION  STATUS:  There is no immunization history on file for this patient.  Diabetes She presents for her follow-up diabetic visit. She has type 2 diabetes mellitus. Onset time: Diagnosed at approx age of 38. Her disease course has been improving. There are no hypoglycemic associated symptoms. Pertinent negatives for diabetes include no fatigue. There are no hypoglycemic complications. Symptoms are stable. There are no diabetic complications. Risk factors for coronary artery disease include diabetes mellitus, dyslipidemia, obesity and sedentary lifestyle. Current diabetic treatment includes oral agent (triple therapy), insulin injections and oral agent (dual therapy) (and Mounjaro). She is compliant with treatment most of the time. Her weight is decreasing steadily. She is following a generally unhealthy diet. When asked about meal planning, she reported none. She has not had a previous visit with a dietitian. She participates in exercise intermittently. Her home blood glucose trend is decreasing steadily. Her bedtime blood glucose range is generally 140-180 mg/dl. (She presents today with her CGM showing at goal glycemic profile.  Her POCT A1c today is 7.2%, improving from last visit of 8%.  She stopped her insulin since last visit for significant low glucose.  Analysis of her CGM shows TIR 94%, TAR 6%, TBR 0% with a GMI of 6.8%.  She notes she has cut out sodas altogether!) An ACE inhibitor/angiotensin II receptor blocker is being taken. She does not see a podiatrist.Eye exam is current.  Hyperlipidemia This is a chronic problem. The current episode started more than 1 year ago. The problem is uncontrolled. Recent lipid tests were reviewed and are variable. Exacerbating diseases include diabetes and obesity. Factors aggravating her hyperlipidemia include fatty foods. Current antihyperlipidemic treatment includes statins. The current treatment provides mild improvement of lipids. Compliance problems include  adherence to diet and adherence to exercise.  Risk factors for coronary artery disease include diabetes mellitus, dyslipidemia, obesity and hypertension.  Hypertension This is a chronic problem. The current episode started more than 1 year ago. The problem is unchanged. The problem is controlled. There are no associated agents to hypertension. Risk factors for coronary artery disease include diabetes mellitus, dyslipidemia and obesity. Past treatments include angiotensin blockers. Compliance problems include diet and exercise.  Identifiable causes of hypertension include sleep apnea.    Review of systems  Constitutional: + steadily decreasing body weight,  current Body mass index is 35.63 kg/m. , no fatigue, no subjective hyperthermia, no subjective hypothermia Eyes: no blurry vision, no xerophthalmia ENT:  no sore throat, no nodules palpated in throat, no dysphagia/odynophagia, no hoarseness Cardiovascular: no chest pain, no shortness of breath, no palpitations, no leg swelling Respiratory: no cough, no shortness of breath Gastrointestinal: + mild nausea right after Mounjaro injection, no vomiting/diarrhea Musculoskeletal: no muscle/joint aches Skin: no rashes, no hyperemia Neurological: no tremors, no numbness, no tingling, no dizziness Psychiatric: no depression, no anxiety   Objective:     BP 102/68 (BP Location: Left Arm, Patient Position: Sitting, Cuff Size: Large)   Pulse 83   Ht 5\' 4"  (1.626 m)   Wt 207 lb 9.6 oz (94.2 kg)   BMI 35.63 kg/m   Wt Readings from Last 3 Encounters:  06/13/23 207 lb 9.6 oz (94.2 kg)  03/04/23 212 lb 3.2 oz (96.3 kg)  02/07/23 215 lb (97.5 kg)     BP Readings from Last 3 Encounters:  06/13/23 102/68  03/04/23 110/76  02/07/23 116/68      Physical Exam- Limited  Constitutional:  Body mass index is 35.63 kg/m. , not in acute distress, normal state of mind Eyes:  EOMI, no exophthalmos Musculoskeletal: no gross deformities, strength intact  in all four extremities, no gross restriction of joint movements Skin:  no rashes, no hyperemia Neurological: no tremor with outstretched hands    Diabetic Foot Exam - Simple   Simple Foot Form Diabetic Foot exam was performed with the following findings: Yes 06/13/2023  3:47 PM  Visual Inspection No deformities, no ulcerations, no other skin breakdown bilaterally: Yes Sensation Testing Intact to touch and monofilament testing bilaterally: Yes Pulse Check Posterior Tibialis and Dorsalis pulse intact bilaterally: Yes Comments     CMP ( most recent) CMP     Component Value Date/Time   NA 135 02/07/2023 1912   NA 140 10/25/2022 0935   K 3.8 02/07/2023 1912   CL 99 02/07/2023 1912   CO2 27 02/07/2023 1912   GLUCOSE 207 (H) 02/07/2023 1912   BUN 12 02/07/2023 1912   BUN 11 10/25/2022 0935   CREATININE 0.56 02/07/2023 1912   CALCIUM 8.5 (L) 02/07/2023 1912   PROT 7.3 10/25/2022 0935   ALBUMIN 4.5 10/25/2022 0935   AST 24 10/25/2022 0935   ALT 32 10/25/2022 0935   ALKPHOS 87 10/25/2022 0935   BILITOT 0.5 10/25/2022 0935   GFRNONAA >60 02/07/2023 1912   GFRAA >90 04/15/2012 0247     Diabetic Labs (most recent): Lab Results  Component Value Date   HGBA1C 7.2 (A) 06/13/2023   HGBA1C 8.0 (A) 03/04/2023   HGBA1C 7.3 (A) 10/31/2022   MICROALBUR 10 mg /L 10/31/2022   MICROALBUR 30 10/18/2021     Lipid Panel ( most recent) Lipid Panel     Component Value Date/Time   CHOL 192 10/25/2022 0935   TRIG 414 (H) 10/25/2022 0935   HDL 42 10/25/2022 0935   CHOLHDL 4.6 (H) 10/25/2022 0935   LDLCALC 83 10/25/2022 0935   LABVLDL 67 (H) 10/25/2022 0935      Lab Results  Component Value Date   TSH 1.140 10/25/2022   TSH 1.330 10/16/2021   FREET4 1.09 10/25/2022   FREET4 1.17 10/16/2021           Assessment & Plan:   1) Type 2 diabetes mellitus without complication, without long-term current use of insulin (HCC)  She presents today with her CGM showing at goal  glycemic profile.  Her POCT A1c today is 7.2%, improving from last visit of 8%.  She stopped her insulin since last visit  for significant low glucose.  Analysis of her CGM shows TIR 94%, TAR 6%, TBR 0% with a GMI of 6.8%.  She notes she has cut out sodas altogether!  - Monique Foster has currently uncontrolled symptomatic type 2 DM since 47 years of age.  -Recent labs reviewed.    - I had a long discussion with her about the progressive nature of diabetes and the pathology behind its complications. -her diabetes is not currently complicated but she remains at a high risk for more acute and chronic complications which include CAD, CVA, CKD, retinopathy, and neuropathy. These are all discussed in detail with her.  The following Lifestyle Medicine recommendations according to American College of Lifestyle Medicine [facility]) were discussed and offered to patient and she agrees to start the journey:  A. Whole Foods, Plant-based plate comprising of fruits and vegetables, plant-based proteins, whole-grain carbohydrates was discussed in detail with the patient.   A list for source of those nutrients were also provided to the patient.  Patient will use only water or unsweetened tea for hydration. B.  The need to stay away from risky substances including alcohol, smoking; obtaining 7 to 9 hours of restorative sleep, at least 150 minutes of moderate intensity exercise weekly, the importance of healthy social connections,  and stress reduction techniques were discussed. C.  A full color page of  Calorie density of various food groups per pound showing examples of each food groups was provided to the patient.  - Nutritional counseling repeated at each appointment due to patients tendency to fall back in to old habits.  - The patient admits there is a room for improvement in their diet and drink choices. -  Suggestion is made for the patient to avoid simple carbohydrates from their diet including Cakes, Sweet  Desserts / Pastries, Ice Cream, Soda (diet and regular), Sweet Tea, Candies, Chips, Cookies, Sweet Pastries, Store Bought Juices, Alcohol in Excess of 1-2 drinks a day, Artificial Sweeteners, Coffee Creamer, and "Sugar-free" Products. This will help patient to have stable blood glucose profile and potentially avoid unintended weight gain.   - I encouraged the patient to switch to unprocessed or minimally processed complex starch and increased protein intake (animal or plant source), fruits, and vegetables.   - Patient is advised to stick to a routine mealtimes to eat 3 meals a day and avoid unnecessary snacks (to snack only to correct hypoglycemia).  - she will be scheduled with Norm Salt, RDN, CDE for diabetes education.  - I have approached her with the following individualized plan to manage  her diabetes and patient agrees:   -She is advised to stay off her Toujeo for now given her excellent control.  She can continue Metformin 1000 mg ER daily with breakfast and Mounjaro 10 mg SQ weekly (hesitant to increase as she still has some nausea after injections).   -She is encouraged to continue monitoring blood glucose at least twice daily (using her CGM), before breakfast and before bed, and to call the clinic if she has readings less than 70 or greater than 300 for 3 tests in a row.    - Specific targets for  A1c;  LDL, HDL,  and Triglycerides were discussed with the patient.  2) Blood Pressure /Hypertension:  her blood pressure is controlled to target.  She is advised to continue Losartan 25 mg po daily.  3) Lipids/Hyperlipidemia:    Her most recent lipid panel from 04/10/23 shows elevated triglycerides of 873 (improving).  She  is advised to continue Lipitor 10 mg po daily at bedtime and Gemfibrozil 600 mg po twice daily.  Side effects and precautions discussed with her.  She is advised to avoid fried foods and butter.    4)  Weight/Diet:  her Body mass index is 35.63 kg/m.  -  clearly  complicating her diabetes care.   she is a candidate for weight loss. I discussed with her the fact that loss of 5 - 10% of her  current body weight will have the most impact on her diabetes management.  Exercise, and detailed carbohydrates information provided  -  detailed on discharge instructions.  5) Vitamin D deficiency Her recent vitamin d level was 26.3.  I advised her to start OTC vitamin D3 5000 units daily as supplement.  6) Chronic Care/Health Maintenance: -she is on Statin medications and is encouraged to initiate and continue to follow up with Ophthalmology, Dentist, Podiatrist at least yearly or according to recommendations, and advised to stay away from smoking. I have recommended yearly flu vaccine and pneumonia vaccine at least every 5 years; moderate intensity exercise for up to 150 minutes weekly; and sleep for at least 7 hours a day.  - she is advised to maintain close follow up with Sasser, Clarene Critchley, MD for primary care needs, as well as her other providers for optimal and coordinated care.      I spent  20  minutes in the care of the patient today including review of labs from CMP, Lipids, Thyroid Function, Hematology (current and previous including abstractions from other facilities); face-to-face time discussing  her blood glucose readings/logs, discussing hypoglycemia and hyperglycemia episodes and symptoms, medications doses, her options of short and long term treatment based on the latest standards of care / guidelines;  discussion about incorporating lifestyle medicine;  and documenting the encounter. Risk reduction counseling performed per USPSTF guidelines to reduce obesity and cardiovascular risk factors.     Please refer to Patient Instructions for Blood Glucose Monitoring and Insulin/Medications Dosing Guide"  in media tab for additional information. Please  also refer to " Patient Self Inventory" in the Media  tab for reviewed elements of pertinent patient  history.  Monique Foster participated in the discussions, expressed understanding, and voiced agreement with the above plans.  All questions were answered to her satisfaction. she is encouraged to contact clinic should she have any questions or concerns prior to her return visit.    Follow up plan: - Return in about 4 months (around 10/13/2023) for Diabetes F/U with A1c in office, No previsit labs, Bring meter and logs.   Ronny Bacon, [facility] [address] [place], [place] [address] Phone: [phone] Fax: [phone]  06/13/2023, 4:20 PM

## 2023-07-12 DIAGNOSIS — E1165 Type 2 diabetes mellitus with hyperglycemia: Secondary | ICD-10-CM | POA: Diagnosis not present

## 2023-07-12 DIAGNOSIS — E7849 Other hyperlipidemia: Secondary | ICD-10-CM | POA: Diagnosis not present

## 2023-07-18 DIAGNOSIS — E7849 Other hyperlipidemia: Secondary | ICD-10-CM | POA: Diagnosis not present

## 2023-07-18 DIAGNOSIS — E1165 Type 2 diabetes mellitus with hyperglycemia: Secondary | ICD-10-CM | POA: Diagnosis not present

## 2023-07-18 DIAGNOSIS — G4733 Obstructive sleep apnea (adult) (pediatric): Secondary | ICD-10-CM | POA: Diagnosis not present

## 2023-10-14 ENCOUNTER — Ambulatory Visit: Payer: BC Managed Care – PPO | Admitting: Nurse Practitioner

## 2023-11-05 ENCOUNTER — Encounter: Payer: Self-pay | Admitting: Nurse Practitioner

## 2023-11-05 ENCOUNTER — Ambulatory Visit (INDEPENDENT_AMBULATORY_CARE_PROVIDER_SITE_OTHER): Payer: BC Managed Care – PPO | Admitting: Nurse Practitioner

## 2023-11-05 VITALS — BP 123/91 | HR 83 | Ht 64.0 in | Wt 208.8 lb

## 2023-11-05 DIAGNOSIS — E782 Mixed hyperlipidemia: Secondary | ICD-10-CM

## 2023-11-05 DIAGNOSIS — Z794 Long term (current) use of insulin: Secondary | ICD-10-CM | POA: Diagnosis not present

## 2023-11-05 DIAGNOSIS — Z7984 Long term (current) use of oral hypoglycemic drugs: Secondary | ICD-10-CM

## 2023-11-05 DIAGNOSIS — E119 Type 2 diabetes mellitus without complications: Secondary | ICD-10-CM

## 2023-11-05 DIAGNOSIS — I10 Essential (primary) hypertension: Secondary | ICD-10-CM

## 2023-11-05 DIAGNOSIS — Z7985 Long-term (current) use of injectable non-insulin antidiabetic drugs: Secondary | ICD-10-CM | POA: Diagnosis not present

## 2023-11-05 LAB — POCT GLYCOSYLATED HEMOGLOBIN (HGB A1C): Hemoglobin A1C: 7.8 % — AB (ref 4.0–5.6)

## 2023-11-05 LAB — POCT UA - MICROALBUMIN
Creatinine, POC: 300 mg/dL
Microalbumin Ur, POC: 80 mg/L

## 2023-11-05 MED ORDER — METFORMIN HCL ER 500 MG PO TB24: 1000.0000 mg | ORAL_TABLET | Freq: Every day | ORAL | 3 refills | Status: DC

## 2023-11-05 MED ORDER — TIRZEPATIDE 12.5 MG/0.5ML ~~LOC~~ SOAJ: 12.5000 mg | SUBCUTANEOUS | 1 refills | Status: DC

## 2023-11-05 MED ORDER — FREESTYLE LIBRE 3 SENSOR MISC: 3 refills | Status: DC

## 2023-11-05 NOTE — Progress Notes (Signed)
Endocrinology Follow Up Note       11/05/2023, 3:55 PM   Subjective:    Patient ID: Monique Foster, female    DOB: February 11, 1976.  Monique Foster is being seen in follow up after being seen in consultation for management of currently uncontrolled symptomatic diabetes requested by  Estanislado Pandy, MD.   Past Medical History:  Diagnosis Date   Asthma    Diabetes mellitus without complication (HCC)    GERD (gastroesophageal reflux disease)     Past Surgical History:  Procedure Laterality Date   CESAREAN SECTION     TUBAL LIGATION      Social History   Socioeconomic History   Marital status: Married    Spouse name: Not on file   Number of children: Not on file   Years of education: Not on file   Highest education level: Not on file  Occupational History   Not on file  Tobacco Use   Smoking status: Former   Smokeless tobacco: Never  Vaping Use   Vaping status: Never Used  Substance and Sexual Activity   Alcohol use: No   Drug use: No   Sexual activity: Not on file  Other Topics Concern   Not on file  Social History Narrative   Not on file   Social Determinants of Health   Financial Resource Strain: Not on file  Food Insecurity: Not on file  Transportation Needs: Not on file  Physical Activity: Not on file  Stress: Not on file  Social Connections: Not on file    Family History  Problem Relation Age of Onset   Diabetes Mother    Osteoporosis Mother    Diabetes Father    Heart attack Father     Outpatient Encounter Medications as of 11/05/2023  Medication Sig   albuterol (VENTOLIN HFA) 108 (90 Base) MCG/ACT inhaler Inhale 2 puffs into the lungs every 4 (four) hours as needed for wheezing or shortness of breath.   cetirizine (ZYRTEC) 10 MG tablet Take 10 mg by mouth daily.   cetirizine (ZYRTEC) 10 MG tablet Take 10 mg by mouth 2 (two) times daily.   esomeprazole (NEXIUM) 20 MG capsule  Take 20 mg by mouth daily.    esomeprazole (NEXIUM) 20 MG capsule Take 20 mg by mouth daily at 12 noon.   gemfibrozil (LOPID) 600 MG tablet Take 600 mg by mouth 2 (two) times daily.   Insulin Pen Needle (PEN NEEDLES) 31G X 5 MM MISC Use to inject insulin once daily   losartan (COZAAR) 25 MG tablet Take 25 mg by mouth daily.   Multiple Vitamin (MULTIVITAMIN) capsule Take 1 capsule by mouth daily.   ondansetron (ZOFRAN) 4 MG tablet Take 1 tablet (4 mg total) by mouth every 8 (eight) hours as needed for nausea or vomiting.   OVER THE COUNTER MEDICATION AMbren daily for hot flashes   RELION PEN NEEDLES 32G X 4 MM MISC SMARTSIG:Injection Daily   tirzepatide (MOUNJARO) 12.5 MG/0.5ML Pen Inject 12.5 mg into the skin once a week.   [DISCONTINUED] Continuous Glucose Sensor (FREESTYLE LIBRE 3 SENSOR) MISC Change sensor every 14 days.   [DISCONTINUED] metFORMIN (GLUCOPHAGE-XR) 500 MG 24 hr  tablet Take 2 tablets (1,000 mg total) by mouth daily with breakfast.   [DISCONTINUED] tirzepatide (MOUNJARO) 10 MG/0.5ML Pen Inject 10 mg into the skin once a week.   atorvastatin (LIPITOR) 10 MG tablet Take 10 mg by mouth daily. (Patient not taking: Reported on 06/13/2023)   Continuous Glucose Sensor (FREESTYLE LIBRE 3 SENSOR) MISC Change sensor every 14 days.   metFORMIN (GLUCOPHAGE-XR) 500 MG 24 hr tablet Take 2 tablets (1,000 mg total) by mouth daily with breakfast.   No facility-administered encounter medications on file as of 11/05/2023.    ALLERGIES: Allergies  Allergen Reactions   Iodine Shortness Of Breath and Rash   Levofloxacin Swelling    Facial Area Facial Area   Shellfish Allergy Shortness Of Breath, Nausea And Vomiting and Rash   Shellfish-Derived Products Nausea And Vomiting, Rash and Shortness Of Breath   Latex Itching and Rash   Penicillins Rash    VACCINATION STATUS:  There is no immunization history on file for this patient.  Diabetes She presents for her follow-up diabetic visit. She  has type 2 diabetes mellitus. Onset time: Diagnosed at approx age of 41. Her disease course has been worsening. There are no hypoglycemic associated symptoms. Pertinent negatives for diabetes include no fatigue. There are no hypoglycemic complications. Symptoms are stable. There are no diabetic complications. Risk factors for coronary artery disease include diabetes mellitus, dyslipidemia, obesity and sedentary lifestyle. Current diabetic treatment includes oral agent (monotherapy) (and Mounjaro). She is compliant with treatment most of the time. Her weight is decreasing steadily. She is following a generally unhealthy diet. When asked about meal planning, she reported none. She has not had a previous visit with a dietitian. She participates in exercise intermittently. Her home blood glucose trend is fluctuating minimally. Her bedtime blood glucose range is generally 180-200 mg/dl. (She presents today with her CGM showing slightly above target glycemic profile.  Her POCT A1c today is 7.8%, increasing from last visit of 7.2%.  Analysis of her CGM shows TIR 72%, TAR 28%, TBR 0% with a GMI of 7.3%.) An ACE inhibitor/angiotensin II receptor blocker is being taken. She does not see a podiatrist.Eye exam is current.  Hyperlipidemia This is a chronic problem. The current episode started more than 1 year ago. The problem is uncontrolled. Recent lipid tests were reviewed and are variable. Exacerbating diseases include diabetes and obesity. Factors aggravating her hyperlipidemia include fatty foods. Current antihyperlipidemic treatment includes statins. The current treatment provides mild improvement of lipids. Compliance problems include adherence to diet and adherence to exercise.  Risk factors for coronary artery disease include diabetes mellitus, dyslipidemia, obesity and hypertension.  Hypertension This is a chronic problem. The current episode started more than 1 year ago. The problem is unchanged. The problem is  controlled. There are no associated agents to hypertension. Risk factors for coronary artery disease include diabetes mellitus, dyslipidemia and obesity. Past treatments include angiotensin blockers. Compliance problems include diet and exercise.  Identifiable causes of hypertension include sleep apnea.    Review of systems  Constitutional: + steadily decreasing body weight,  current Body mass index is 35.84 kg/m. , no fatigue, no subjective hyperthermia, no subjective hypothermia Eyes: no blurry vision, no xerophthalmia ENT: no sore throat, no nodules palpated in throat, no dysphagia/odynophagia, no hoarseness Cardiovascular: no chest pain, no shortness of breath, no palpitations, no leg swelling Respiratory: no cough, no shortness of breath Gastrointestinal: + mild nausea right after Mounjaro injection, no vomiting/diarrhea Musculoskeletal: no muscle/joint aches Skin: no rashes, no hyperemia  Neurological: no tremors, no numbness, no tingling, no dizziness Psychiatric: no depression, no anxiety   Objective:     BP (!) 123/91 (BP Location: Left Arm, Patient Position: Sitting, Cuff Size: Large)   Pulse 83   Ht 5\' 4"  (1.626 m)   Wt 208 lb 12.8 oz (94.7 kg)   BMI 35.84 kg/m   Wt Readings from Last 3 Encounters:  11/05/23 208 lb 12.8 oz (94.7 kg)  06/13/23 207 lb 9.6 oz (94.2 kg)  03/04/23 212 lb 3.2 oz (96.3 kg)     BP Readings from Last 3 Encounters:  11/05/23 (!) 123/91  06/13/23 102/68  03/04/23 110/76      Physical Exam- Limited  Constitutional:  Body mass index is 35.84 kg/m. , not in acute distress, normal state of mind Eyes:  EOMI, no exophthalmos Musculoskeletal: no gross deformities, strength intact in all four extremities, no gross restriction of joint movements Skin:  no rashes, no hyperemia Neurological: no tremor with outstretched hands    Diabetic Foot Exam - Simple   No data filed     CMP ( most recent) CMP     Component Value Date/Time   NA 135  02/07/2023 1912   NA 140 10/25/2022 0935   K 3.8 02/07/2023 1912   CL 99 02/07/2023 1912   CO2 27 02/07/2023 1912   GLUCOSE 207 (H) 02/07/2023 1912   BUN 12 02/07/2023 1912   BUN 11 10/25/2022 0935   CREATININE 0.56 02/07/2023 1912   CALCIUM 8.5 (L) 02/07/2023 1912   PROT 7.3 10/25/2022 0935   ALBUMIN 4.5 10/25/2022 0935   AST 24 10/25/2022 0935   ALT 32 10/25/2022 0935   ALKPHOS 87 10/25/2022 0935   BILITOT 0.5 10/25/2022 0935   GFRNONAA >60 02/07/2023 1912   GFRAA >90 04/15/2012 0247     Diabetic Labs (most recent): Lab Results  Component Value Date   HGBA1C 7.8 (A) 11/05/2023   HGBA1C 7.2 (A) 06/13/2023   HGBA1C 8.0 (A) 03/04/2023   MICROALBUR 80 mg/L 11/05/2023   MICROALBUR 10 mg /L 10/31/2022   MICROALBUR 30 10/18/2021     Lipid Panel ( most recent) Lipid Panel     Component Value Date/Time   CHOL 192 10/25/2022 0935   TRIG 414 (H) 10/25/2022 0935   HDL 42 10/25/2022 0935   CHOLHDL 4.6 (H) 10/25/2022 0935   LDLCALC 83 10/25/2022 0935   LABVLDL 67 (H) 10/25/2022 0935      Lab Results  Component Value Date   TSH 1.140 10/25/2022   TSH 1.330 10/16/2021   FREET4 1.09 10/25/2022   FREET4 1.17 10/16/2021           Assessment & Plan:   1) Type 2 diabetes mellitus without complication, without long-term current use of insulin (HCC)  She presents today with her CGM showing slightly above target glycemic profile.  Her POCT A1c today is 7.8%, increasing from last visit of 7.2%.  Analysis of her CGM shows TIR 72%, TAR 28%, TBR 0% with a GMI of 7.3%.  - Monique Foster has currently uncontrolled symptomatic type 2 DM since 47 years of age.  -Recent labs reviewed.  Her POCT UM shows mild microalbuminuria.   Will recheck CMP prior to next visit to assess.  - I had a long discussion with her about the progressive nature of diabetes and the pathology behind its complications. -her diabetes is not currently complicated but she remains at a high risk for more acute  and chronic complications  which include CAD, CVA, CKD, retinopathy, and neuropathy. These are all discussed in detail with her.  The following Lifestyle Medicine recommendations according to American College of Lifestyle Medicine [facility]) were discussed and offered to patient and she agrees to start the journey:  A. Whole Foods, Plant-based plate comprising of fruits and vegetables, plant-based proteins, whole-grain carbohydrates was discussed in detail with the patient.   A list for source of those nutrients were also provided to the patient.  Patient will use only water or unsweetened tea for hydration. B.  The need to stay away from risky substances including alcohol, smoking; obtaining 7 to 9 hours of restorative sleep, at least 150 minutes of moderate intensity exercise weekly, the importance of healthy social connections,  and stress reduction techniques were discussed. C.  A full color page of  Calorie density of various food groups per pound showing examples of each food groups was provided to the patient.  - Nutritional counseling repeated at each appointment due to patients tendency to fall back in to old habits.  - The patient admits there is a room for improvement in their diet and drink choices. -  Suggestion is made for the patient to avoid simple carbohydrates from their diet including Cakes, Sweet Desserts / Pastries, Ice Cream, Soda (diet and regular), Sweet Tea, Candies, Chips, Cookies, Sweet Pastries, Store Bought Juices, Alcohol in Excess of 1-2 drinks a day, Artificial Sweeteners, Coffee Creamer, and "Sugar-free" Products. This will help patient to have stable blood glucose profile and potentially avoid unintended weight gain.   - I encouraged the patient to switch to unprocessed or minimally processed complex starch and increased protein intake (animal or plant source), fruits, and vegetables.   - Patient is advised to stick to a routine mealtimes to eat 3 meals a day and avoid  unnecessary snacks (to snack only to correct hypoglycemia).  - she will be scheduled with Norm Salt, RDN, CDE for diabetes education.  - I have approached her with the following individualized plan to manage  her diabetes and patient agrees:   -Will increase her Mounjaro to 12.5 mg SQ weekly and continue Metformin 1000 mg ER daily after breakfast.   -She is encouraged to continue monitoring blood glucose at least twice daily (using her CGM), before breakfast and before bed, and to call the clinic if she has readings less than 70 or greater than 300 for 3 tests in a row.    - Specific targets for  A1c;  LDL, HDL,  and Triglycerides were discussed with the patient.  2) Blood Pressure /Hypertension:  her blood pressure is controlled to target.  She is advised to continue Losartan 25 mg po daily.  3) Lipids/Hyperlipidemia:    Her most recent lipid panel from 04/10/23 shows elevated triglycerides of 873 (improving).  She is advised to continue Lipitor 10 mg po daily at bedtime and Gemfibrozil 600 mg po twice daily.  Side effects and precautions discussed with her.  She is advised to avoid fried foods and butter.  Will recheck lipid panel prior to next visit.  4)  Weight/Diet:  her Body mass index is 35.84 kg/m.  -  clearly complicating her diabetes care.   she is a candidate for weight loss. I discussed with her the fact that loss of 5 - 10% of her  current body weight will have the most impact on her diabetes management.  Exercise, and detailed carbohydrates information provided  -  detailed on discharge instructions.  5)  Vitamin D deficiency Her recent vitamin d level was 26.3.  Will recheck prior to next visit.  6) Chronic Care/Health Maintenance: -she is on Statin medications and is encouraged to initiate and continue to follow up with Ophthalmology, Dentist, Podiatrist at least yearly or according to recommendations, and advised to stay away from smoking. I have recommended yearly flu  vaccine and pneumonia vaccine at least every 5 years; moderate intensity exercise for up to 150 minutes weekly; and sleep for at least 7 hours a day.  - she is advised to maintain close follow up with Sasser, Clarene Critchley, MD for primary care needs, as well as her other providers for optimal and coordinated care.      I spent  36  minutes in the care of the patient today including review of labs from CMP, Lipids, Thyroid Function, Hematology (current and previous including abstractions from other facilities); face-to-face time discussing  her blood glucose readings/logs, discussing hypoglycemia and hyperglycemia episodes and symptoms, medications doses, her options of short and long term treatment based on the latest standards of care / guidelines;  discussion about incorporating lifestyle medicine;  and documenting the encounter. Risk reduction counseling performed per USPSTF guidelines to reduce obesity and cardiovascular risk factors.     Please refer to Patient Instructions for Blood Glucose Monitoring and Insulin/Medications Dosing Guide"  in media tab for additional information. Please  also refer to " Patient Self Inventory" in the Media  tab for reviewed elements of pertinent patient history.  Monique Foster participated in the discussions, expressed understanding, and voiced agreement with the above plans.  All questions were answered to her satisfaction. she is encouraged to contact clinic should she have any questions or concerns prior to her return visit.    Follow up plan: - Return in about 4 months (around 03/04/2024) for Diabetes F/U with A1c in office, Previsit labs, Bring meter and logs.   Ronny Bacon, [facility] [address] [place], [place] [address] Phone: [phone] Fax: [phone]  11/05/2023, 3:55 PM

## 2023-11-06 ENCOUNTER — Encounter: Payer: Self-pay | Admitting: Nurse Practitioner

## 2023-11-07 MED ORDER — FREESTYLE LIBRE 3 PLUS SENSOR MISC: 3 refills | Status: AC

## 2023-12-04 DIAGNOSIS — E1165 Type 2 diabetes mellitus with hyperglycemia: Secondary | ICD-10-CM | POA: Diagnosis not present

## 2023-12-04 DIAGNOSIS — E7849 Other hyperlipidemia: Secondary | ICD-10-CM | POA: Diagnosis not present

## 2023-12-10 DIAGNOSIS — Z23 Encounter for immunization: Secondary | ICD-10-CM | POA: Diagnosis not present

## 2023-12-10 DIAGNOSIS — E7849 Other hyperlipidemia: Secondary | ICD-10-CM | POA: Diagnosis not present

## 2023-12-10 DIAGNOSIS — G4733 Obstructive sleep apnea (adult) (pediatric): Secondary | ICD-10-CM | POA: Diagnosis not present

## 2023-12-10 DIAGNOSIS — E1165 Type 2 diabetes mellitus with hyperglycemia: Secondary | ICD-10-CM | POA: Diagnosis not present

## 2024-02-13 ENCOUNTER — Emergency Department (HOSPITAL_COMMUNITY)
Admission: EM | Admit: 2024-02-13 | Discharge: 2024-02-13 | Disposition: A | Discharge status: Home / Self Care | Payer: 59 | Attending: Emergency Medicine | Admitting: Emergency Medicine

## 2024-02-13 ENCOUNTER — Other Ambulatory Visit: Payer: Self-pay

## 2024-02-13 ENCOUNTER — Emergency Department (HOSPITAL_COMMUNITY): Payer: 59

## 2024-02-13 DIAGNOSIS — Z7984 Long term (current) use of oral hypoglycemic drugs: Secondary | ICD-10-CM | POA: Diagnosis not present

## 2024-02-13 DIAGNOSIS — J45909 Unspecified asthma, uncomplicated: Secondary | ICD-10-CM | POA: Diagnosis not present

## 2024-02-13 DIAGNOSIS — N132 Hydronephrosis with renal and ureteral calculous obstruction: Secondary | ICD-10-CM | POA: Insufficient documentation

## 2024-02-13 DIAGNOSIS — Z7951 Long term (current) use of inhaled steroids: Secondary | ICD-10-CM | POA: Diagnosis not present

## 2024-02-13 DIAGNOSIS — N281 Cyst of kidney, acquired: Secondary | ICD-10-CM | POA: Diagnosis not present

## 2024-02-13 DIAGNOSIS — R109 Unspecified abdominal pain: Secondary | ICD-10-CM | POA: Diagnosis not present

## 2024-02-13 DIAGNOSIS — E119 Type 2 diabetes mellitus without complications: Secondary | ICD-10-CM | POA: Insufficient documentation

## 2024-02-13 DIAGNOSIS — R162 Hepatomegaly with splenomegaly, not elsewhere classified: Secondary | ICD-10-CM | POA: Diagnosis not present

## 2024-02-13 DIAGNOSIS — Z91040 Latex allergy status: Secondary | ICD-10-CM | POA: Diagnosis not present

## 2024-02-13 DIAGNOSIS — N201 Calculus of ureter: Secondary | ICD-10-CM

## 2024-02-13 LAB — URINALYSIS, ROUTINE W REFLEX MICROSCOPIC
Bacteria, UA: NONE SEEN
Bilirubin Urine: NEGATIVE
Glucose, UA: 50 mg/dL — AB
Ketones, ur: NEGATIVE mg/dL
Leukocytes,Ua: NEGATIVE
Nitrite: NEGATIVE
Protein, ur: 30 mg/dL — AB
RBC / HPF: >50 RBC/hpf (ref 0–5)
Specific Gravity, Urine: 1.009 (ref 1.005–1.030)
pH: 7 (ref 5.0–8.0)

## 2024-02-13 LAB — CBC WITH DIFFERENTIAL/PLATELET
Abs Immature Granulocytes: 0.05 10*3/uL (ref 0.00–0.07)
Basophils Absolute: 0 10*3/uL (ref 0.0–0.1)
Basophils Relative: 1 %
Eosinophils Absolute: 0.1 10*3/uL (ref 0.0–0.5)
Eosinophils Relative: 2 %
HCT: 39.1 % (ref 36.0–46.0)
Hemoglobin: 13.7 g/dL (ref 12.0–15.0)
Immature Granulocytes: 1 %
Lymphocytes Relative: 31 %
Lymphs Abs: 1.4 10*3/uL (ref 0.7–4.0)
MCH: 28.9 pg (ref 26.0–34.0)
MCHC: 35 g/dL (ref 30.0–36.0)
MCV: 82.5 fL (ref 80.0–100.0)
Monocytes Absolute: 0.3 10*3/uL (ref 0.1–1.0)
Monocytes Relative: 5 %
Neutro Abs: 2.8 10*3/uL (ref 1.7–7.7)
Neutrophils Relative %: 60 %
Platelets: 146 10*3/uL — ABNORMAL LOW (ref 150–400)
RBC: 4.74 MIL/uL (ref 3.87–5.11)
RDW: 11.7 % (ref 11.5–15.5)
WBC: 4.6 10*3/uL (ref 4.0–10.5)
nRBC: 0 % (ref 0.0–0.2)

## 2024-02-13 LAB — COMPREHENSIVE METABOLIC PANEL
ALT: 32 U/L (ref 0–44)
AST: 26 U/L (ref 15–41)
Albumin: 3.9 g/dL (ref 3.5–5.0)
Alkaline Phosphatase: 51 U/L (ref 38–126)
Anion gap: 8 (ref 5–15)
BUN: 16 mg/dL (ref 6–20)
CO2: 25 mmol/L (ref 22–32)
Calcium: 8.7 mg/dL — ABNORMAL LOW (ref 8.9–10.3)
Chloride: 102 mmol/L (ref 98–111)
Creatinine, Ser: 0.5 mg/dL (ref 0.44–1.00)
GFR, Estimated: >60 mL/min (ref >60)
Glucose, Bld: 216 mg/dL — ABNORMAL HIGH (ref 70–99)
Potassium: 4 mmol/L (ref 3.5–5.1)
Sodium: 135 mmol/L (ref 135–145)
Total Bilirubin: 0.9 mg/dL (ref 0.0–1.2)
Total Protein: 6.7 g/dL (ref 6.5–8.1)

## 2024-02-13 LAB — LIPASE, BLOOD: Lipase: 44 U/L (ref 11–51)

## 2024-02-13 MED ORDER — TAMSULOSIN HCL 0.4 MG PO CAPS: 0.4000 mg | ORAL_CAPSULE | Freq: Every day | ORAL | 0 refills | Status: DC

## 2024-02-13 MED ORDER — OXYCODONE-ACETAMINOPHEN 5-325 MG PO TABS: 1.0000 | ORAL_TABLET | ORAL | 0 refills | Status: DC | PRN

## 2024-02-13 MED ORDER — OXYCODONE-ACETAMINOPHEN 5-325 MG PO TABS
1.0000 | ORAL_TABLET | Freq: Once | ORAL | Status: AC
  Administered 2024-02-13: 1 via ORAL
  Filled 2024-02-13: qty 1

## 2024-02-13 NOTE — ED Provider Notes (Signed)
 [place] EMERGENCY DEPARTMENT AT [facility] Provider Note   CSN: [id number] Arrival date & time: 02/13/24  5784     History  Chief Complaint  Patient presents with   Flank Pain    Monique Foster is a 48 y.o. female.  HPI 48 year old female with history of asthma and diabetes presents with right flank pain.  Woke her up around 3 AM.  She took 2 ibuprofen which helped a little bit.  She has some nausea but never vomited.  She had 2 bowel movements but they were otherwise normal.  The first time she urinated there was only a small output but then afterwards she has noticed pink urine.  There is no dysuria but it was hurting her flank after she urinated.  No fevers, vomiting.  No cough or dyspnea.  Right now the pain is moderate in intensity.  It radiates from her flank down towards her groin.  No prior history of kidney stones.  Home Medications Prior to Admission medications   Medication Sig Start Date End Date Taking? Authorizing Provider  oxyCODONE-acetaminophen (PERCOCET/ROXICET) 5-325 MG tablet Take 1 tablet by mouth every 4 (four) hours as needed for severe pain (pain score 7-10). 02/13/24  Yes Pricilla Loveless, MD  tamsulosin (FLOMAX) 0.4 MG CAPS capsule Take 1 capsule (0.4 mg total) by mouth daily. 02/13/24  Yes Pricilla Loveless, MD  albuterol (VENTOLIN HFA) 108 (90 Base) MCG/ACT inhaler Inhale 2 puffs into the lungs every 4 (four) hours as needed for wheezing or shortness of breath.    [provider]  atorvastatin (LIPITOR) 10 MG tablet Take 10 mg by mouth daily. Patient not taking: Reported on 06/13/2023 03/19/21   [provider]  cetirizine (ZYRTEC) 10 MG tablet Take 10 mg by mouth daily.    [provider]  cetirizine (ZYRTEC) 10 MG tablet Take 10 mg by mouth 2 (two) times daily.    [provider]  Continuous Glucose Sensor (FREESTYLE LIBRE 3 PLUS SENSOR) MISC Change sensor every 15 days. 11/07/23   Dani Gobble, NP   esomeprazole (NEXIUM) 20 MG capsule Take 20 mg by mouth daily.     [provider]  esomeprazole (NEXIUM) 20 MG capsule Take 20 mg by mouth daily at 12 noon.    [provider]  gemfibrozil (LOPID) 600 MG tablet Take 600 mg by mouth 2 (two) times daily. 05/27/23   [provider]  Insulin Pen Needle (PEN NEEDLES) 31G X 5 MM MISC Use to inject insulin once daily 10/18/21   Dani Gobble, NP  losartan (COZAAR) 25 MG tablet Take 25 mg by mouth daily. 03/20/21   [provider]  metFORMIN (GLUCOPHAGE-XR) 500 MG 24 hr tablet Take 2 tablets (1,000 mg total) by mouth daily with breakfast. 11/05/23   Dani Gobble, NP  Multiple Vitamin (MULTIVITAMIN) capsule Take 1 capsule by mouth daily.    [provider]  ondansetron (ZOFRAN) 4 MG tablet Take 1 tablet (4 mg total) by mouth every 8 (eight) hours as needed for nausea or vomiting. 11/20/22   Dani Gobble, NP  OVER THE COUNTER MEDICATION AMbren daily for hot flashes    [provider]  RELION PEN NEEDLES 32G X 4 MM MISC SMARTSIG:Injection Daily 10/18/21   [provider]  tirzepatide Greggory Keen) 12.5 MG/0.5ML Pen Inject 12.5 mg into the skin once a week. 11/05/23   Dani Gobble, NP      Allergies    Iodine, Levofloxacin, Shellfish  allergy, Shellfish-derived products, Latex, and Penicillins    Review of Systems   Review of Systems  Constitutional:  Negative for fever.  Respiratory:  Negative for cough and shortness of breath.   Gastrointestinal:  Positive for abdominal pain and nausea. Negative for vomiting.  Genitourinary:  Positive for flank pain and hematuria. Negative for dysuria.    Physical Exam Updated Vital Signs BP 124/75   Pulse 76   Temp 98 F (36.7 C) (Oral)   Resp 17   Ht 5' 3.5" (1.613 m)   Wt 95.3 kg   LMP 05/01/2021   SpO2 97%   BMI 36.62 kg/m  Physical Exam Vitals and nursing note reviewed.  Constitutional:      General: She is not in  acute distress.    Appearance: She is well-developed. She is not ill-appearing or diaphoretic.  HENT:     Head: Normocephalic and atraumatic.  Cardiovascular:     Rate and Rhythm: Normal rate and regular rhythm.     Heart sounds: Normal heart sounds.  Pulmonary:     Effort: Pulmonary effort is normal.     Breath sounds: Normal breath sounds.  Abdominal:     Palpations: Abdomen is soft.     Tenderness: There is no abdominal tenderness. There is no right CVA tenderness or left CVA tenderness.     Comments: No rash  Skin:    General: Skin is warm and dry.  Neurological:     Mental Status: She is alert.     ED Results / Procedures / Treatments   Labs (all labs ordered are listed, but only abnormal results are displayed) Labs Reviewed  URINALYSIS, ROUTINE W REFLEX MICROSCOPIC - Abnormal; Notable for the following components:      Result Value   Glucose, UA 50 (*)    Hgb urine dipstick LARGE (*)    Protein, ur 30 (*)    All other components within normal limits  COMPREHENSIVE METABOLIC PANEL - Abnormal; Notable for the following components:   Glucose, Bld 216 (*)    Calcium 8.7 (*)    All other components within normal limits  CBC WITH DIFFERENTIAL/PLATELET - Abnormal; Notable for the following components:   Platelets 146 (*)    All other components within normal limits  LIPASE, BLOOD    EKG None  Radiology CT Renal Stone Study Result Date: 02/13/2024 CLINICAL DATA:  Right flank pain and pink tinged urine. EXAM: CT ABDOMEN AND PELVIS WITHOUT CONTRAST TECHNIQUE: Multidetector CT imaging of the abdomen and pelvis was performed following the standard protocol without IV contrast. RADIATION DOSE REDUCTION: This exam was performed according to the departmental dose-optimization program which includes automated exposure control, adjustment of the mA and/or kV according to patient size and/or use of iterative reconstruction technique. COMPARISON:  CT without contrast 12/22/2011.  FINDINGS: Lower chest: No abnormality. Hepatobiliary: Enlarged liver, measures 24 cm length with moderate to severe steatosis, similar enlargement on prior study. Gallbladder is contracted and not well seen but some images suggest possibility of pericholecystic edema which may be seen with cholecystitis. No calcified gallstones or biliary dilatation. Consider ultrasound for further evaluation. Pancreas: Unremarkable without contrast. Spleen: Mildly prominent measuring 13.5 cm AP. Several or size to the 2012 exam. No focal abnormality. Adrenals/Urinary Tract: There is no adrenal mass. Again noted is a Bosniak 1 cyst in the posterior lower right kidney measuring 1.6 cm and 2.5 Hounsfield units. No follow-up imaging is needed. No interval change. There is no other contour deforming abnormality  of either unenhanced kidneys. There are occasional punctate nonobstructive caliceal stones within both kidneys. On the right there is moderate hydronephrosis due to a 5 x 6 x 4 mm UPJ stone. The bilateral ureters are otherwise clear. The bladder is unremarkable for its degree of distention. Stomach/Bowel: No dilatation or wall thickening including the retrocecal appendix. Vascular/Lymphatic: There is a circumaortic left renal vein. No significant vascular findings are present. No enlarged abdominal or pelvic lymph nodes. Reproductive: Uterus and bilateral adnexa are unremarkable. Other: No abdominal wall hernia or abnormality. No abdominopelvic ascites. Musculoskeletal: There are degenerative changes and mild levoscoliosis of the lumbar spine. Thoracic spondylosis. No acute or significant osseous findings. IMPRESSION: 1. Moderate right hydronephrosis due to a 5 x 6 x 4 mm UPJ stone. 2. Bilateral nonobstructive micronephrolithiasis. 3. Hepatomegaly with moderate to severe steatosis. Mild splenomegaly. 4. Contracted gallbladder but some images suggest possibility of pericholecystic edema which may be seen with cholecystitis. No  calcified gallstones or biliary dilatation. Consider ultrasound for further evaluation. Electronically Signed   By: Almira Bar M.D.   On: 02/13/2024 07:39    Procedures Procedures    Medications Ordered in ED Medications  oxyCODONE-acetaminophen (PERCOCET/ROXICET) 5-325 MG per tablet 1 tablet (1 tablet Oral Given 02/13/24 1610)    ED Course/ Medical Decision Making/ A&P                                 Medical Decision Making Amount and/or Complexity of Data Reviewed Labs: ordered.    Details: Normal WBC.  Unremarkable LFTs. Radiology: ordered and independent interpretation performed.    Details: Proximal right ureteral stone  Risk Prescription drug management.   Patient presents with right flank pain consistent with a ureteral stone.  CT confirms a stone though there is also questionable gallbladder findings.  However no right upper quadrant tenderness or pain.  Repeat exam show no tenderness in the right upper quadrant and so I do not think a gallbladder emergency is likely and I do not think an ultrasound is needed.  LFTs and lipase are also unremarkable.  Pain is better with some Percocet and she has been advised to use this as well as NSAIDs at home.  Urine shows hematuria but no infection.  Will refer to urology and placed on tamsulosin given the size of the stone.  Given return precautions.         Final Clinical Impression(s) / ED Diagnoses Final diagnoses:  Right ureteral stone    Rx / DC Orders ED Discharge Orders          Ordered    oxyCODONE-acetaminophen (PERCOCET/ROXICET) 5-325 MG tablet  Every 4 hours PRN        02/13/24 0929    tamsulosin (FLOMAX) 0.4 MG CAPS capsule  Daily        02/13/24 0929              Pricilla Loveless, MD 02/13/24 1030

## 2024-02-13 NOTE — ED Triage Notes (Signed)
 Patient coming to ED for evaluation of R sided flank pain.  Reports pain started around 3 AM this morning.  Pain woke patient from sleep.  No hx of kidney stones.  Has noticed "pink" color to urine and "it feels like I have to pee more than I am."  Having nausea, no vomiting or diarrhea.  Took "2 Ibuprofen" for pain PTA without improvement.

## 2024-02-13 NOTE — Discharge Instructions (Signed)
 Take ibuprofen to help with the pain.  If this does not help or you develop new or worsening or breakthrough pain then you can take the oxycodone.  Also take the tamsulosin daily to help with moving the stone down the ureter.  Follow-up with the urologist listed, call for an appointment.  If you develop fever, new or worsening or uncontrolled pain, urinary tract infection symptoms such as burning when urinating, or any other new/concerning symptoms and return to the ER or call 911.

## 2024-02-13 NOTE — ED Notes (Signed)
 See triage notes. Pt a/o. Color wnl. Resting well. Pt taken to ct at this time

## 2024-02-13 NOTE — ED Notes (Signed)
 Pt wanting med for pain. Edp aware.

## 2024-02-21 ENCOUNTER — Emergency Department (HOSPITAL_COMMUNITY)
Admission: EM | Admit: 2024-02-21 | Discharge: 2024-02-21 | Disposition: A | Discharge status: Home / Self Care | Payer: 59 | Attending: Emergency Medicine | Admitting: Emergency Medicine

## 2024-02-21 ENCOUNTER — Encounter (HOSPITAL_COMMUNITY): Payer: Self-pay

## 2024-02-21 ENCOUNTER — Other Ambulatory Visit: Payer: Self-pay

## 2024-02-21 DIAGNOSIS — Z794 Long term (current) use of insulin: Secondary | ICD-10-CM | POA: Diagnosis not present

## 2024-02-21 DIAGNOSIS — R109 Unspecified abdominal pain: Secondary | ICD-10-CM | POA: Diagnosis present

## 2024-02-21 DIAGNOSIS — E119 Type 2 diabetes mellitus without complications: Secondary | ICD-10-CM | POA: Insufficient documentation

## 2024-02-21 DIAGNOSIS — Z91040 Latex allergy status: Secondary | ICD-10-CM | POA: Insufficient documentation

## 2024-02-21 DIAGNOSIS — Z7984 Long term (current) use of oral hypoglycemic drugs: Secondary | ICD-10-CM | POA: Insufficient documentation

## 2024-02-21 DIAGNOSIS — Z79899 Other long term (current) drug therapy: Secondary | ICD-10-CM | POA: Insufficient documentation

## 2024-02-21 DIAGNOSIS — N23 Unspecified renal colic: Secondary | ICD-10-CM | POA: Insufficient documentation

## 2024-02-21 LAB — CBC
HCT: 40.2 % (ref 36.0–46.0)
Hemoglobin: 14 g/dL (ref 12.0–15.0)
MCH: 28.6 pg (ref 26.0–34.0)
MCHC: 34.8 g/dL (ref 30.0–36.0)
MCV: 82.2 fL (ref 80.0–100.0)
Platelets: 156 10*3/uL (ref 150–400)
RBC: 4.89 MIL/uL (ref 3.87–5.11)
RDW: 11.6 % (ref 11.5–15.5)
WBC: 8.1 10*3/uL (ref 4.0–10.5)
nRBC: 0 % (ref 0.0–0.2)

## 2024-02-21 LAB — URINALYSIS, W/ REFLEX TO CULTURE (INFECTION SUSPECTED)
Bacteria, UA: NONE SEEN
Bilirubin Urine: NEGATIVE
Glucose, UA: 50 mg/dL — AB
Ketones, ur: NEGATIVE mg/dL
Leukocytes,Ua: NEGATIVE
Nitrite: NEGATIVE
Protein, ur: 30 mg/dL — AB
RBC / HPF: >50 RBC/hpf (ref 0–5)
Specific Gravity, Urine: 1.014 (ref 1.005–1.030)
pH: 6 (ref 5.0–8.0)

## 2024-02-21 LAB — BASIC METABOLIC PANEL
Anion gap: 12 (ref 5–15)
BUN: 22 mg/dL — ABNORMAL HIGH (ref 6–20)
CO2: 24 mmol/L (ref 22–32)
Calcium: 9.7 mg/dL (ref 8.9–10.3)
Chloride: 99 mmol/L (ref 98–111)
Creatinine, Ser: 0.83 mg/dL (ref 0.44–1.00)
GFR, Estimated: >60 mL/min (ref >60)
Glucose, Bld: 221 mg/dL — ABNORMAL HIGH (ref 70–99)
Potassium: 4.2 mmol/L (ref 3.5–5.1)
Sodium: 135 mmol/L (ref 135–145)

## 2024-02-21 LAB — LACTIC ACID, PLASMA: Lactic Acid, Venous: 1.8 mmol/L (ref 0.5–1.9)

## 2024-02-21 MED ORDER — ONDANSETRON HCL 4 MG/2ML IJ SOLN
4.0000 mg | Freq: Once | INTRAMUSCULAR | Status: AC
  Administered 2024-02-21: 4 mg via INTRAVENOUS
  Filled 2024-02-21: qty 2

## 2024-02-21 MED ORDER — TAMSULOSIN HCL 0.4 MG PO CAPS: 0.4000 mg | ORAL_CAPSULE | Freq: Every day | ORAL | 0 refills | Status: DC

## 2024-02-21 MED ORDER — SODIUM CHLORIDE 0.9 % IV BOLUS
1000.0000 mL | Freq: Once | INTRAVENOUS | Status: AC
  Administered 2024-02-21: 1000 mL via INTRAVENOUS

## 2024-02-21 MED ORDER — MORPHINE SULFATE (PF) 4 MG/ML IV SOLN
4.0000 mg | Freq: Once | INTRAVENOUS | Status: AC
  Administered 2024-02-21: 4 mg via INTRAVENOUS
  Filled 2024-02-21: qty 1

## 2024-02-21 MED ORDER — TAMSULOSIN HCL 0.4 MG PO CAPS
0.4000 mg | ORAL_CAPSULE | Freq: Once | ORAL | Status: AC
  Administered 2024-02-21: 0.4 mg via ORAL
  Filled 2024-02-21: qty 1

## 2024-02-21 MED ORDER — KETOROLAC TROMETHAMINE 30 MG/ML IJ SOLN
30.0000 mg | Freq: Once | INTRAMUSCULAR | Status: AC
  Administered 2024-02-21: 30 mg via INTRAVENOUS
  Filled 2024-02-21: qty 1

## 2024-02-21 NOTE — ED Notes (Signed)
 Pt states that she cannot go to the bathroom right now. This tech told pt to use call light when they had to go so we could collect a UA.

## 2024-02-21 NOTE — ED Provider Notes (Signed)
 [place] EMERGENCY DEPARTMENT AT [facility] Provider Note   CSN: [id number] Arrival date & time: 02/21/24  1834     History  Chief Complaint  Patient presents with   Flank Pain    Monique Foster is a 47 y.o. female.  She has a history of diabetes.  She was here 8 days ago with right flank pain and found to have a 6 mm UPJ stone with moderate hydronephrosis.  She was discharged on pain medication.  She said she was doing well until today when pain became much more severe associated with nausea and vomiting.  She also feels like she is urinating less.  Felt some chills.  The history is provided by the patient.  Flank Pain This is a recurrent problem. The current episode started 6 to 12 hours ago. The problem occurs constantly. The problem has not changed since onset.Associated symptoms include abdominal pain. Pertinent negatives include no chest pain, no headaches and no shortness of breath. Nothing aggravates the symptoms. Nothing relieves the symptoms. She has tried rest for the symptoms. The treatment provided no relief.       Home Medications Prior to Admission medications   Medication Sig Start Date End Date Taking? Authorizing Provider  albuterol (VENTOLIN HFA) 108 (90 Base) MCG/ACT inhaler Inhale 2 puffs into the lungs every 4 (four) hours as needed for wheezing or shortness of breath.    [provider]  atorvastatin (LIPITOR) 10 MG tablet Take 10 mg by mouth daily. Patient not taking: Reported on 06/13/2023 03/19/21   [provider]  cetirizine (ZYRTEC) 10 MG tablet Take 10 mg by mouth daily.    [provider]  cetirizine (ZYRTEC) 10 MG tablet Take 10 mg by mouth 2 (two) times daily.    [provider]  Continuous Glucose Sensor (FREESTYLE LIBRE 3 PLUS SENSOR) MISC Change sensor every 15 days. 11/07/23   Dani Gobble, NP  esomeprazole (NEXIUM) 20 MG capsule Take 20 mg by mouth daily.     [provider]   esomeprazole (NEXIUM) 20 MG capsule Take 20 mg by mouth daily at 12 noon.    [provider]  gemfibrozil (LOPID) 600 MG tablet Take 600 mg by mouth 2 (two) times daily. 05/27/23   [provider]  Insulin Pen Needle (PEN NEEDLES) 31G X 5 MM MISC Use to inject insulin once daily 10/18/21   Dani Gobble, NP  losartan (COZAAR) 25 MG tablet Take 25 mg by mouth daily. 03/20/21   [provider]  metFORMIN (GLUCOPHAGE-XR) 500 MG 24 hr tablet Take 2 tablets (1,000 mg total) by mouth daily with breakfast. 11/05/23   Dani Gobble, NP  Multiple Vitamin (MULTIVITAMIN) capsule Take 1 capsule by mouth daily.    [provider]  ondansetron (ZOFRAN) 4 MG tablet Take 1 tablet (4 mg total) by mouth every 8 (eight) hours as needed for nausea or vomiting. 11/20/22   Dani Gobble, NP  OVER THE COUNTER MEDICATION AMbren daily for hot flashes    [provider]  oxyCODONE-acetaminophen (PERCOCET/ROXICET) 5-325 MG tablet Take 1 tablet by mouth every 4 (four) hours as needed for severe pain (pain score 7-10). 02/13/24   Pricilla Loveless, MD  RELION PEN NEEDLES 32G X 4 MM MISC SMARTSIG:Injection Daily 10/18/21   [provider]  tamsulosin (FLOMAX) 0.4 MG CAPS capsule Take 1 capsule (0.4 mg total) by mouth daily. 02/13/24   Pricilla Loveless, MD  tirzepatide [facility]) 12.5 MG/0.5ML Pen  Inject 12.5 mg into the skin once a week. 11/05/23   Dani Gobble, NP      Allergies    Iodine, Levofloxacin, Shellfish allergy, Shellfish-derived products, Latex, and Penicillins    Review of Systems   Review of Systems  Respiratory:  Negative for shortness of breath.   Cardiovascular:  Negative for chest pain.  Gastrointestinal:  Positive for abdominal pain.  Genitourinary:  Positive for flank pain.  Neurological:  Negative for headaches.    Physical Exam Updated Vital Signs BP (!) 145/92   Pulse 94   Temp 99.1 F (37.3 C) (Oral)   Ht 5' 3.5" (1.613 m)    Wt 95.3 kg   LMP 05/01/2021   SpO2 97%   BMI 36.62 kg/m  Physical Exam Vitals and nursing note reviewed.  Constitutional:      General: She is not in acute distress.    Appearance: Normal appearance. She is well-developed.  HENT:     Head: Normocephalic and atraumatic.  Eyes:     Conjunctiva/sclera: Conjunctivae normal.  Cardiovascular:     Rate and Rhythm: Normal rate and regular rhythm.     Heart sounds: No murmur heard. Pulmonary:     Effort: Pulmonary effort is normal. No respiratory distress.     Breath sounds: Normal breath sounds.  Abdominal:     Palpations: Abdomen is soft.     Tenderness: There is no abdominal tenderness. There is no guarding or rebound.  Musculoskeletal:        General: No deformity.     Cervical back: Neck supple.  Skin:    General: Skin is warm and dry.     Capillary Refill: Capillary refill takes less than 2 seconds.  Neurological:     General: No focal deficit present.     Mental Status: She is alert.     ED Results / Procedures / Treatments   Labs (all labs ordered are listed, but only abnormal results are displayed) Labs Reviewed  BASIC METABOLIC PANEL - Abnormal; Notable for the following components:      Result Value   Glucose, Bld 221 (*)    BUN 22 (*)    All other components within normal limits  URINALYSIS, W/ REFLEX TO CULTURE (INFECTION SUSPECTED) - Abnormal; Notable for the following components:   Glucose, UA 50 (*)    Hgb urine dipstick LARGE (*)    Protein, ur 30 (*)    All other components within normal limits  CBC  LACTIC ACID, PLASMA    EKG None  Radiology No results found.  Procedures Procedures    Medications Ordered in ED Medications  sodium chloride 0.9 % bolus 1,000 mL (has no administration in time range)  morphine (PF) 4 MG/ML injection 4 mg (has no administration in time range)  ondansetron (ZOFRAN) injection 4 mg (has no administration in time range)  ketorolac (TORADOL) 30 MG/ML injection 30  mg (has no administration in time range)    ED Course/ Medical Decision Making/ A&P Clinical Course as of 02/22/24 1023  Fri Feb 21, 2024  2318 States her pain is adequately controlled.  She is comfortable plan for with discharge.  She said she only got 7 pills of the Flomax so we will refill that.  She is off for the weekend.  She has reached out to urology already but does not have an appointment for another few weeks. [MB]    Clinical Course User Index [MB] Terrilee Files, MD  Medical Decision Making Amount and/or Complexity of Data Reviewed Labs: ordered.  Risk Prescription drug management.   This patient complains of flank pain nausea vomiting, decreased urine output; this involves an extensive number of treatment Options and is a complaint that carries with it a high risk of complications and morbidity. The differential includes obstruction, renal colic, sepsis, infected stone, dehydration, AKI  I ordered, reviewed and interpreted labs, which included CBC normal chemistries elevated glucose normal renal function lactate normal urinalysis without signs of infection I ordered medication IV fluids IV pain medication and reviewed PMP when indicated. Additional history obtained from patient's family member Previous records obtained and reviewed in epic including recent ED note Cardiac monitoring reviewed, normal sinus rhythm Social determinants considered, no significant barriers Critical Interventions: None  After the interventions stated above, I reevaluated the patient and found patient's pain to be improved and nontoxic-appearing Admission and further testing considered, no indications for admission or further workup at this time.  Will update her Flomax prescription.  Return instructions discussed.         Final Clinical Impression(s) / ED Diagnoses Final diagnoses:  Ureteral colic    Rx / DC Orders ED Discharge Orders           Ordered    tamsulosin (FLOMAX) 0.4 MG CAPS capsule  Daily        02/21/24 2319              Terrilee Files, MD 02/22/24 1025

## 2024-02-21 NOTE — ED Triage Notes (Signed)
 Pt stated that she had a kidney stone last Thursday and was sent home. Pt here today complaining of chills, flank pain uncontrolled, vomiting and little to no urine output.

## 2024-02-28 LAB — COMPREHENSIVE METABOLIC PANEL
ALT: 38 IU/L — ABNORMAL HIGH (ref 0–32)
AST: 31 IU/L (ref 0–40)
Albumin: 4.5 g/dL (ref 3.9–4.9)
Alkaline Phosphatase: 59 IU/L (ref 44–121)
BUN/Creatinine Ratio: 23 (ref 9–23)
BUN: 17 mg/dL (ref 6–24)
Bilirubin Total: 0.8 mg/dL (ref 0.0–1.2)
CO2: 26 mmol/L (ref 20–29)
Calcium: 10.1 mg/dL (ref 8.7–10.2)
Chloride: 101 mmol/L (ref 96–106)
Creatinine, Ser: 0.73 mg/dL (ref 0.57–1.00)
Globulin, Total: 2.5 g/dL (ref 1.5–4.5)
Glucose: 137 mg/dL — ABNORMAL HIGH (ref 70–99)
Potassium: 4.3 mmol/L (ref 3.5–5.2)
Sodium: 143 mmol/L (ref 134–144)
Total Protein: 7 g/dL (ref 6.0–8.5)
eGFR: 103 mL/min/{1.73_m2} (ref >59)

## 2024-02-28 LAB — LIPID PANEL
Chol/HDL Ratio: 5.9 ratio — ABNORMAL HIGH (ref 0.0–4.4)
Cholesterol, Total: 219 mg/dL — ABNORMAL HIGH (ref 100–199)
HDL: 37 mg/dL — ABNORMAL LOW (ref >39)
LDL Chol Calc (NIH): 148 mg/dL — ABNORMAL HIGH (ref 0–99)
Triglycerides: 184 mg/dL — ABNORMAL HIGH (ref 0–149)
VLDL Cholesterol Cal: 34 mg/dL (ref 5–40)

## 2024-02-28 LAB — TSH: TSH: 1.21 u[IU]/mL (ref 0.450–4.500)

## 2024-02-28 LAB — VITAMIN D 25 HYDROXY (VIT D DEFICIENCY, FRACTURES): Vit D, 25-Hydroxy: 37.2 ng/mL (ref 30.0–100.0)

## 2024-02-28 LAB — T4, FREE: Free T4: 1.35 ng/dL (ref 0.82–1.77)

## 2024-03-05 ENCOUNTER — Ambulatory Visit: Payer: BC Managed Care – PPO | Admitting: Nurse Practitioner

## 2024-03-05 ENCOUNTER — Encounter: Payer: Self-pay | Admitting: Nurse Practitioner

## 2024-03-05 VITALS — BP 124/80 | HR 79 | Ht 64.0 in | Wt 206.6 lb

## 2024-03-05 DIAGNOSIS — Z794 Long term (current) use of insulin: Secondary | ICD-10-CM

## 2024-03-05 DIAGNOSIS — Z7985 Long-term (current) use of injectable non-insulin antidiabetic drugs: Secondary | ICD-10-CM

## 2024-03-05 DIAGNOSIS — I10 Essential (primary) hypertension: Secondary | ICD-10-CM

## 2024-03-05 DIAGNOSIS — E782 Mixed hyperlipidemia: Secondary | ICD-10-CM

## 2024-03-05 DIAGNOSIS — Z7984 Long term (current) use of oral hypoglycemic drugs: Secondary | ICD-10-CM

## 2024-03-05 DIAGNOSIS — E119 Type 2 diabetes mellitus without complications: Secondary | ICD-10-CM | POA: Diagnosis not present

## 2024-03-05 LAB — POCT GLYCOSYLATED HEMOGLOBIN (HGB A1C): Hemoglobin A1C: 7.6 % — AB (ref 4.0–5.6)

## 2024-03-05 MED ORDER — TIRZEPATIDE 12.5 MG/0.5ML ~~LOC~~ SOAJ: 12.5000 mg | SUBCUTANEOUS | 1 refills | Status: DC

## 2024-03-05 NOTE — Progress Notes (Signed)
 Endocrinology Follow Up Note       03/05/2024, 4:24 PM   Subjective:    Patient ID: Monique Foster, female    DOB: 1976-09-08.  Monique Foster is being seen in follow up after being seen in consultation for management of currently uncontrolled symptomatic diabetes requested by  Estanislado Pandy, MD.   Past Medical History:  Diagnosis Date   Asthma    Diabetes mellitus without complication (HCC)    GERD (gastroesophageal reflux disease)     Past Surgical History:  Procedure Laterality Date   CESAREAN SECTION     TUBAL LIGATION      Social History   Socioeconomic History   Marital status: Married    Spouse name: Not on file   Number of children: Not on file   Years of education: Not on file   Highest education level: Not on file  Occupational History   Not on file  Tobacco Use   Smoking status: Former   Smokeless tobacco: Never  Vaping Use   Vaping status: Never Used  Substance and Sexual Activity   Alcohol use: No   Drug use: No   Sexual activity: Not on file  Other Topics Concern   Not on file  Social History Narrative   Not on file   Social Drivers of Health   Financial Resource Strain: Not on file  Food Insecurity: Not on file  Transportation Needs: Not on file  Physical Activity: Not on file  Stress: Not on file  Social Connections: Not on file    Family History  Problem Relation Age of Onset   Diabetes Mother    Osteoporosis Mother    Diabetes Father    Heart attack Father     Outpatient Encounter Medications as of 03/05/2024  Medication Sig   albuterol (VENTOLIN HFA) 108 (90 Base) MCG/ACT inhaler Inhale 2 puffs into the lungs every 4 (four) hours as needed for wheezing or shortness of breath.   cetirizine (ZYRTEC) 10 MG tablet Take 10 mg by mouth daily.   Cholecalciferol (VITAMIN D3) 50 MCG (2000 UT) CAPS Take by mouth.   Coenzyme Q10 200 MG capsule Take 200 mg by mouth  daily.   Continuous Glucose Sensor (FREESTYLE LIBRE 3 PLUS SENSOR) MISC Change sensor every 15 days.   esomeprazole (NEXIUM) 20 MG capsule Take 20 mg by mouth daily.    gemfibrozil (LOPID) 600 MG tablet Take 600 mg by mouth 2 (two) times daily.   Insulin Pen Needle (PEN NEEDLES) 31G X 5 MM MISC Use to inject insulin once daily   losartan (COZAAR) 25 MG tablet Take 25 mg by mouth daily.   metFORMIN (GLUCOPHAGE-XR) 500 MG 24 hr tablet Take 2 tablets (1,000 mg total) by mouth daily with breakfast.   Misc Natural Products (GLUCOSAMINE CHOND MSM FORMULA PO) Take by mouth. Patient states that she takes 2 a day   Multiple Vitamin (MULTIVITAMIN) capsule Take 1 capsule by mouth daily.   ondansetron (ZOFRAN) 4 MG tablet Take 1 tablet (4 mg total) by mouth every 8 (eight) hours as needed for nausea or vomiting.   OVER THE COUNTER MEDICATION AMbren daily for hot flashes  oxyCODONE-acetaminophen (PERCOCET/ROXICET) 5-325 MG tablet Take 1 tablet by mouth every 4 (four) hours as needed for severe pain (pain score 7-10).   RELION PEN NEEDLES 32G X 4 MM MISC SMARTSIG:Injection Daily   rosuvastatin (CRESTOR) 5 MG tablet Take 5 mg by mouth daily.   tamsulosin (FLOMAX) 0.4 MG CAPS capsule Take 1 capsule (0.4 mg total) by mouth daily.   [DISCONTINUED] tirzepatide (MOUNJARO) 12.5 MG/0.5ML Pen Inject 12.5 mg into the skin once a week.   atorvastatin (LIPITOR) 10 MG tablet Take 10 mg by mouth daily. (Patient not taking: Reported on 06/13/2023)   cetirizine (ZYRTEC) 10 MG tablet Take 10 mg by mouth 2 (two) times daily. (Patient not taking: Reported on 03/05/2024)   esomeprazole (NEXIUM) 20 MG capsule Take 20 mg by mouth daily at 12 noon. (Patient not taking: Reported on 03/05/2024)   tirzepatide (MOUNJARO) 12.5 MG/0.5ML Pen Inject 12.5 mg into the skin once a week.   No facility-administered encounter medications on file as of 03/05/2024.    ALLERGIES: Allergies  Allergen Reactions   Iodine Shortness Of Breath and  Rash   Levofloxacin Swelling    Facial Area Facial Area   Shellfish Allergy Shortness Of Breath, Nausea And Vomiting and Rash   Shellfish-Derived Products Nausea And Vomiting, Rash and Shortness Of Breath   Latex Itching and Rash   Penicillins Rash    VACCINATION STATUS:  There is no immunization history on file for this patient.  Diabetes She presents for her follow-up diabetic visit. She has type 2 diabetes mellitus. Onset time: Diagnosed at approx age of 43. Her disease course has been improving. There are no hypoglycemic associated symptoms. Pertinent negatives for diabetes include no fatigue. There are no hypoglycemic complications. Symptoms are stable. There are no diabetic complications. Risk factors for coronary artery disease include diabetes mellitus, dyslipidemia, obesity and sedentary lifestyle. Current diabetic treatment includes oral agent (monotherapy) (and Mounjaro). She is compliant with treatment most of the time. Her weight is decreasing steadily. She is following a generally unhealthy diet. When asked about meal planning, she reported none. She has not had a previous visit with a dietitian. She participates in exercise intermittently. Her home blood glucose trend is decreasing steadily. Her overall blood glucose range is 140-180 mg/dl. (She presents today with her CGM showing no data since March 8th but she is wearing a new sensor today.  Her POCT A1c today is 7.6%, improving from last visit of 7.8%.  She is dealing with a kidney stone, has appt with urology Monday.  She notes the Greggory Keen is working for her, controlling her appetite.) An ACE inhibitor/angiotensin II receptor blocker is being taken. She does not see a podiatrist.Eye exam is current.  Hyperlipidemia This is a chronic problem. The current episode started more than 1 year ago. The problem is uncontrolled. Recent lipid tests were reviewed and are variable. Exacerbating diseases include diabetes and obesity. Factors  aggravating her hyperlipidemia include fatty foods. Current antihyperlipidemic treatment includes statins. The current treatment provides mild improvement of lipids. Compliance problems include adherence to diet and adherence to exercise.  Risk factors for coronary artery disease include diabetes mellitus, dyslipidemia, obesity and hypertension.  Hypertension This is a chronic problem. The current episode started more than 1 year ago. The problem is unchanged. The problem is controlled. There are no associated agents to hypertension. Risk factors for coronary artery disease include diabetes mellitus, dyslipidemia and obesity. Past treatments include angiotensin blockers. Compliance problems include diet and exercise.  Identifiable causes of  hypertension include sleep apnea.    Review of systems  Constitutional: + steadily decreasing body weight,  current Body mass index is 35.46 kg/m. , no fatigue, no subjective hyperthermia, no subjective hypothermia Eyes: no blurry vision, no xerophthalmia ENT: no sore throat, no nodules palpated in throat, no dysphagia/odynophagia, no hoarseness Cardiovascular: no chest pain, no shortness of breath, no palpitations, no leg swelling Respiratory: no cough, no shortness of breath Gastrointestinal: + mild nausea right after Mounjaro injection, no vomiting/diarrhea Musculoskeletal: no muscle/joint aches Skin: no rashes, no hyperemia Neurological: no tremors, no numbness, no tingling, no dizziness Psychiatric: no depression, no anxiety   Objective:     BP 124/80 (BP Location: Right Arm, Patient Position: Standing;Sitting, Cuff Size: Large)   Pulse 79   Ht 5\' 4"  (1.626 m)   Wt 206 lb 9.6 oz (93.7 kg)   LMP 05/01/2021   BMI 35.46 kg/m   Wt Readings from Last 3 Encounters:  03/05/24 206 lb 9.6 oz (93.7 kg)  02/21/24 210 lb (95.3 kg)  02/13/24 210 lb (95.3 kg)     BP Readings from Last 3 Encounters:  03/05/24 124/80  02/21/24 121/80  02/13/24 124/75        Physical Exam- Limited  Constitutional:  Body mass index is 35.46 kg/m. , not in acute distress, normal state of mind Eyes:  EOMI, no exophthalmos Musculoskeletal: no gross deformities, strength intact in all four extremities, no gross restriction of joint movements Skin:  no rashes, no hyperemia Neurological: no tremor with outstretched hands    Diabetic Foot Exam - Simple   No data filed     CMP ( most recent) CMP     Component Value Date/Time   NA 143 02/27/2024 0828   K 4.3 02/27/2024 0828   CL 101 02/27/2024 0828   CO2 26 02/27/2024 0828   GLUCOSE 137 (H) 02/27/2024 0828   GLUCOSE 221 (H) 02/21/2024 1914   BUN 17 02/27/2024 0828   CREATININE 0.73 02/27/2024 0828   CALCIUM 10.1 02/27/2024 0828   PROT 7.0 02/27/2024 0828   ALBUMIN 4.5 02/27/2024 0828   AST 31 02/27/2024 0828   ALT 38 (H) 02/27/2024 0828   ALKPHOS 59 02/27/2024 0828   BILITOT 0.8 02/27/2024 0828   GFRNONAA >60 02/21/2024 1914   GFRAA >90 04/15/2012 0247     Diabetic Labs (most recent): Lab Results  Component Value Date   HGBA1C 7.6 (A) 03/05/2024   HGBA1C 7.8 (A) 11/05/2023   HGBA1C 7.2 (A) 06/13/2023   MICROALBUR 80 mg/L 11/05/2023   MICROALBUR 10 mg /L 10/31/2022   MICROALBUR 30 10/18/2021     Lipid Panel ( most recent) Lipid Panel     Component Value Date/Time   CHOL 219 (H) 02/27/2024 0828   TRIG 184 (H) 02/27/2024 0828   HDL 37 (L) 02/27/2024 0828   CHOLHDL 5.9 (H) 02/27/2024 0828   LDLCALC 148 (H) 02/27/2024 0828   LABVLDL 34 02/27/2024 0828      Lab Results  Component Value Date   TSH 1.210 02/27/2024   TSH 1.140 10/25/2022   TSH 1.330 10/16/2021   FREET4 1.35 02/27/2024   FREET4 1.09 10/25/2022   FREET4 1.17 10/16/2021           Assessment & Plan:   1) Type 2 diabetes mellitus without complication, without long-term current use of insulin (HCC)  She presents today with her CGM showing no data since March 8th but she is wearing a new sensor today.   Her POCT  A1c today is 7.6%, improving from last visit of 7.8%.  She is dealing with a kidney stone, has appt with urology Monday.  She notes the Greggory Keen is working for her, controlling her appetite.  - Monique Foster has currently uncontrolled symptomatic type 2 DM since 48 years of age.  -Recent labs reviewed.    - I had a long discussion with her about the progressive nature of diabetes and the pathology behind its complications. -her diabetes is not currently complicated but she remains at a high risk for more acute and chronic complications which include CAD, CVA, CKD, retinopathy, and neuropathy. These are all discussed in detail with her.  The following Lifestyle Medicine recommendations according to American College of Lifestyle Medicine [facility]) were discussed and offered to patient and she agrees to start the journey:  A. Whole Foods, Plant-based plate comprising of fruits and vegetables, plant-based proteins, whole-grain carbohydrates was discussed in detail with the patient.   A list for source of those nutrients were also provided to the patient.  Patient will use only water or unsweetened tea for hydration. B.  The need to stay away from risky substances including alcohol, smoking; obtaining 7 to 9 hours of restorative sleep, at least 150 minutes of moderate intensity exercise weekly, the importance of healthy social connections,  and stress reduction techniques were discussed. C.  A full color page of  Calorie density of various food groups per pound showing examples of each food groups was provided to the patient.  - Nutritional counseling repeated at each appointment due to patients tendency to fall back in to old habits.  - The patient admits there is a room for improvement in their diet and drink choices. -  Suggestion is made for the patient to avoid simple carbohydrates from their diet including Cakes, Sweet Desserts / Pastries, Ice Cream, Soda (diet and regular), Sweet Tea,  Candies, Chips, Cookies, Sweet Pastries, Store Bought Juices, Alcohol in Excess of 1-2 drinks a day, Artificial Sweeteners, Coffee Creamer, and "Sugar-free" Products. This will help patient to have stable blood glucose profile and potentially avoid unintended weight gain.   - I encouraged the patient to switch to unprocessed or minimally processed complex starch and increased protein intake (animal or plant source), fruits, and vegetables.   - Patient is advised to stick to a routine mealtimes to eat 3 meals a day and avoid unnecessary snacks (to snack only to correct hypoglycemia).  - she will be scheduled with Norm Salt, RDN, CDE for diabetes education.  - I have approached her with the following individualized plan to manage  her diabetes and patient agrees:   -She is advised to continue Mounjaro 12.5 mg SQ weekly and Metformin 1000 mg ER daily after breakfast.   -She is encouraged to continue monitoring blood glucose at least twice daily (using her CGM), before breakfast and before bed, and to call the clinic if she has readings less than 70 or greater than 300 for 3 tests in a row.    - Specific targets for  A1c;  LDL, HDL,  and Triglycerides were discussed with the patient.  2) Blood Pressure /Hypertension:  her blood pressure is controlled to target.  She is advised to continue Losartan 25 mg po daily.  3) Lipids/Hyperlipidemia:    Her most recent lipid panel from 02/27/24 shows elevated LDL of 148 and elevated but significantly improved triglycerides of 184.  She is advised to continue Lipitor 10 mg po daily at bedtime and  Gemfibrozil 600 mg po twice daily.  Side effects and precautions discussed with her.  She is advised to avoid fried foods and butter.    4)  Weight/Diet:  her Body mass index is 35.46 kg/m.  -  clearly complicating her diabetes care.   she is a candidate for weight loss. I discussed with her the fact that loss of 5 - 10% of her  current body weight will have the  most impact on her diabetes management.  Exercise, and detailed carbohydrates information provided  -  detailed on discharge instructions.  5) Vitamin D deficiency Her recent vitamin d level was 26.3.  Will recheck prior to next visit.  6) Chronic Care/Health Maintenance: -she is on Statin medications and is encouraged to initiate and continue to follow up with Ophthalmology, Dentist, Podiatrist at least yearly or according to recommendations, and advised to stay away from smoking. I have recommended yearly flu vaccine and pneumonia vaccine at least every 5 years; moderate intensity exercise for up to 150 minutes weekly; and sleep for at least 7 hours a day.  - she is advised to maintain close follow up with Sasser, Clarene Critchley, MD for primary care needs, as well as her other providers for optimal and coordinated care.      I spent  31  minutes in the care of the patient today including review of labs from CMP, Lipids, Thyroid Function, Hematology (current and previous including abstractions from other facilities); face-to-face time discussing  her blood glucose readings/logs, discussing hypoglycemia and hyperglycemia episodes and symptoms, medications doses, her options of short and long term treatment based on the latest standards of care / guidelines;  discussion about incorporating lifestyle medicine;  and documenting the encounter. Risk reduction counseling performed per USPSTF guidelines to reduce obesity and cardiovascular risk factors.     Please refer to Patient Instructions for Blood Glucose Monitoring and Insulin/Medications Dosing Guide"  in media tab for additional information. Please  also refer to " Patient Self Inventory" in the Media  tab for reviewed elements of pertinent patient history.  Monique Foster participated in the discussions, expressed understanding, and voiced agreement with the above plans.  All questions were answered to her satisfaction. she is encouraged to contact  clinic should she have any questions or concerns prior to her return visit.    Follow up plan: - Return in about 4 months (around 07/05/2024) for Diabetes F/U with A1c in office, No previsit labs, Bring meter and logs.   Ronny Bacon, [facility] [address] [place], [place] [address] Phone: [phone] Fax: [phone]  03/05/2024, 4:24 PM

## 2024-03-06 DIAGNOSIS — N200 Calculus of kidney: Secondary | ICD-10-CM | POA: Insufficient documentation

## 2024-03-06 NOTE — Progress Notes (Signed)
 Name: Monique Foster DOB: August 12, 1976 MRN: [id number]  History of Present Illness: Monique Foster is a 48 y.o. female who presents today as a new patient at [facility] [place]. All available relevant medical records have been reviewed.   She reports concern of kidney stone(s).  She denies prior history of kidney stones.  Recent history: > 02/13/2024: Seen in ER for right flank pain. CMP with normal renal function (GFR >60; creatinine 0.50). CBC with no leukocytosis (WBC 4.6). UA showed 0-5 WBC/hpf, >50 RBC/hpf, no bacteria. CT showed a 5 x 6 x 4 mm right UPJ stone with moderate right hydronephrosis. Additional bilateral nonobstructive micronephrolithiasis.  She was discharged with prescriptions for Flomax and Percocet 5-325 mg (#15 dispensed 02/13/2024 per PMP aware controlled substance registry).  > 02/21/2024: Returned to ER for right flank pain / ureteral colic. CMP with normal renal function (GFR >60; creatinine 0.83). CBC with no leukocytosis (WBC 8.1). UA showed 0-5 WBC/hpf, >50 RBC/hpf, no bacteria. Flomax refilled.  > 02/27/2024: Normal renal function (creatinine 0.73, GFR >60).  Today: She denies passing the stone. She reports that in the past week or so her pain has been intermittent and fairly minimal in her right flank and RLQ; has been taking OTC analgesics PRN.  She denies recent fevers.  She denies increased urinary urgency, frequency, nocturia, dysuria, gross hematuria, hesitancy, straining to void, or sensations of incomplete emptying.   Medications: Current Outpatient Medications  Medication Sig Dispense Refill   albuterol (VENTOLIN HFA) 108 (90 Base) MCG/ACT inhaler Inhale 2 puffs into the lungs every 4 (four) hours as needed for wheezing or shortness of breath.     cetirizine (ZYRTEC) 10 MG tablet Take 10 mg by mouth daily.     cetirizine (ZYRTEC) 10 MG tablet Take 10 mg by mouth 2 (two) times daily.     Cholecalciferol (VITAMIN D3) 50 MCG (2000 UT) CAPS  Take by mouth.     Coenzyme Q10 200 MG capsule Take 200 mg by mouth daily.     Continuous Glucose Sensor (FREESTYLE LIBRE 3 PLUS SENSOR) MISC Change sensor every 15 days. 6 each 3   esomeprazole (NEXIUM) 20 MG capsule Take 20 mg by mouth daily.      esomeprazole (NEXIUM) 20 MG capsule Take 20 mg by mouth daily at 12 noon.     gemfibrozil (LOPID) 600 MG tablet Take 600 mg by mouth 2 (two) times daily.     losartan (COZAAR) 25 MG tablet Take 25 mg by mouth daily.     metFORMIN (GLUCOPHAGE-XR) 500 MG 24 hr tablet Take 2 tablets (1,000 mg total) by mouth daily with breakfast. 180 tablet 3   Misc Natural Products (GLUCOSAMINE CHOND MSM FORMULA PO) Take by mouth. Patient states that she takes 2 a day     Multiple Vitamin (MULTIVITAMIN) capsule Take 1 capsule by mouth daily.     ondansetron (ZOFRAN) 4 MG tablet Take 1 tablet (4 mg total) by mouth every 8 (eight) hours as needed for nausea or vomiting. 20 tablet 0   OVER THE COUNTER MEDICATION AMbren daily for hot flashes     oxyCODONE-acetaminophen (PERCOCET/ROXICET) 5-325 MG tablet Take 1 tablet by mouth every 4 (four) hours as needed for severe pain (pain score 7-10). 15 tablet 0   rosuvastatin (CRESTOR) 5 MG tablet Take 5 mg by mouth daily.     tirzepatide (MOUNJARO) 12.5 MG/0.5ML Pen Inject 12.5 mg into the skin once a week. 6 mL 1   atorvastatin (LIPITOR) 10  MG tablet Take 10 mg by mouth daily. (Patient not taking: Reported on 03/09/2024)     Insulin Pen Needle (PEN NEEDLES) 31G X 5 MM MISC Use to inject insulin once daily (Patient not taking: Reported on 03/09/2024) 100 each 11   RELION PEN NEEDLES 32G X 4 MM MISC SMARTSIG:Injection Daily (Patient not taking: Reported on 03/09/2024)     tamsulosin (FLOMAX) 0.4 MG CAPS capsule Take 1 capsule (0.4 mg total) by mouth daily. 30 capsule 0   No current facility-administered medications for this visit.    Allergies: Allergies  Allergen Reactions   Iodine Shortness Of Breath and Rash   Levofloxacin  Swelling    Facial Area Facial Area   Shellfish Allergy Shortness Of Breath, Nausea And Vomiting and Rash   Shellfish-Derived Products Nausea And Vomiting, Rash and Shortness Of Breath   Latex Itching and Rash   Penicillins Rash    Past Medical History:  Diagnosis Date   Asthma    Diabetes mellitus without complication (HCC)    GERD (gastroesophageal reflux disease)    Past Surgical History:  Procedure Laterality Date   CESAREAN SECTION     TUBAL LIGATION     Family History  Problem Relation Age of Onset   Diabetes Mother    Osteoporosis Mother    Diabetes Father    Heart attack Father    Social History   Socioeconomic History   Marital status: Married    Spouse name: Not on file   Number of children: Not on file   Years of education: Not on file   Highest education level: Not on file  Occupational History   Not on file  Tobacco Use   Smoking status: Former   Smokeless tobacco: Never  Vaping Use   Vaping status: Never Used  Substance and Sexual Activity   Alcohol use: No   Drug use: No   Sexual activity: Not on file  Other Topics Concern   Not on file  Social History Narrative   Not on file   Social Drivers of Health   Financial Resource Strain: Not on file  Food Insecurity: Not on file  Transportation Needs: Not on file  Physical Activity: Not on file  Stress: Not on file  Social Connections: Not on file  Intimate Partner Violence: Not on file    SUBJECTIVE  Review of Systems Constitutional: Patient denies any unintentional weight loss or change in strength lntegumentary: Patient denies any rashes or pruritus Cardiovascular: Patient denies chest pain or syncope Respiratory: Patient denies shortness of breath Gastrointestinal: Patient reports some nausea; denies vomiting Musculoskeletal: Patient denies muscle cramps or weakness Neurologic: Patient denies convulsions or seizures Allergic/Immunologic: Patient denies recent allergic  reaction(s) Hematologic/Lymphatic: Patient denies bleeding tendencies Endocrine: Patient denies heat/cold intolerance  GU: As per HPI.  OBJECTIVE Vitals:   03/09/24 0845  BP: 125/87  Pulse: 77  Temp: 98.5 F (36.9 C)   There is no height or weight on file to calculate BMI.  Physical Examination Constitutional: No obvious distress; patient is non-toxic appearing  Cardiovascular: No visible lower extremity edema.  Respiratory: The patient does not have audible wheezing/stridor; respirations do not appear labored  Gastrointestinal: Abdomen non-distended Musculoskeletal: Normal ROM of UEs  Skin: No obvious rashes/open sores  Neurologic: CN 2-12 grossly intact Psychiatric: Answered questions appropriately with normal affect  Hematologic/Lymphatic/Immunologic: No obvious bruises or sites of spontaneous bleeding  UA: Patient did not void in office today - reported urinating prior to arrival  ASSESSMENT Right  ureteral stone - Plan: Urinalysis, Routine w reflex microscopic, DG Abd 1 View  Right flank pain - Plan: Urinalysis, Routine w reflex microscopic, DG Abd 1 View  Kidney stones - Plan: Urinalysis, Routine w reflex microscopic, DG Abd 1 View, tamsulosin (FLOMAX) 0.4 MG CAPS capsule  We reviewed recent history & imaging results. We agreed to proceed with KUB today for recheck.  - If KUB negative today, she would like to repeat CT stone to assess for possible radiolucent stone due to persistent right flank / RLQ abdominal pain. We discussed that alternatively she could be having persistent symptoms due to GU tract irritation / colic secondary to recent stone passage, which would resolve over time. - If KUB shows that the right ureteral stone is still present, will plan to continue medical expulsive therapy with Flomax (Tamsulosin) 0.4 mg daily for another 2 weeks then recheck with KUB at follow up. We also discussed possible surgical treatment such as extracorporeal shock wave  lithotripsy (ESWL) if needed.  For pain management, we discussed the use of opioids versus OTC analgesics.   For nausea she can continue Zofran PRN.  She was advised to contact urology provider or go to the ER if She develops fever >101F, uncontrollable pain, or other significantly concerning symptoms prior to next office visit.  She verbalized understanding and agreement. All questions were answered.   PLAN Advised the following: KUB today.  Flomax 0.4 mg daily; refill sent. Analgesics PRN for pain. Zofran PRN for nausea. Return in about 2 weeks (around 03/23/2024) for UA, PVR, & f/u with Evette Georges NP.  Orders Placed This Encounter  Procedures   DG Abd 1 View    Standing Status:   Future    Number of Occurrences:   1    Expected Date:   03/09/2024    Expiration Date:   03/09/2025    Reason for Exam (SYMPTOM  OR DIAGNOSIS REQUIRED):   kidney stone    Is patient pregnant?:   Unknown (Please Explain)    Preferred imaging location?:   [facility]   Urinalysis, Routine w reflex microscopic   It has been explained that the patient is to follow regularly with their PCP in addition to all other providers involved in their care and to follow instructions provided by these respective offices. Patient advised to contact urology clinic if any urologic-pertaining questions, concerns, new symptoms or problems arise in the interim period.  There are no Patient Instructions on file for this visit.  Electronically signed by: Donnita Falls, MSN, FNP-C, CUNP 03/09/2024 10:19 AM

## 2024-03-09 ENCOUNTER — Encounter: Payer: Self-pay | Admitting: Urology

## 2024-03-09 ENCOUNTER — Ambulatory Visit: Payer: BC Managed Care – PPO | Admitting: Urology

## 2024-03-09 ENCOUNTER — Ambulatory Visit (HOSPITAL_COMMUNITY)
Admission: RE | Admit: 2024-03-09 | Discharge: 2024-03-09 | Disposition: A | Discharge status: Home / Self Care | Source: Ambulatory Visit | Attending: Urology | Admitting: Urology

## 2024-03-09 ENCOUNTER — Other Ambulatory Visit: Payer: Self-pay | Admitting: Urology

## 2024-03-09 VITALS — BP 125/87 | HR 77 | Temp 98.5°F

## 2024-03-09 DIAGNOSIS — R109 Unspecified abdominal pain: Secondary | ICD-10-CM | POA: Insufficient documentation

## 2024-03-09 DIAGNOSIS — N201 Calculus of ureter: Secondary | ICD-10-CM | POA: Diagnosis present

## 2024-03-09 DIAGNOSIS — N200 Calculus of kidney: Secondary | ICD-10-CM

## 2024-03-09 MED ORDER — TAMSULOSIN HCL 0.4 MG PO CAPS
0.4000 mg | ORAL_CAPSULE | Freq: Every day | ORAL | 0 refills | Status: DC
Start: 2024-03-09 — End: 2024-04-02

## 2024-03-09 NOTE — Progress Notes (Signed)
 Sent to S. Clark, CMA: Please let pt know that per my review of her KUB images from today it looks like her right ureteral stone is now in the bladder. Advised to continue Flomax (Tamsulosin) 0.4 mg daily for medical expulsive therapy and follow up with KUB prior in 2 weeks.   Evette Georges, MSN, FNP-C, [facility] Nurse Practitioner [facility] [place]

## 2024-03-30 ENCOUNTER — Ambulatory Visit (HOSPITAL_COMMUNITY)
Admission: RE | Admit: 2024-03-30 | Discharge: 2024-03-30 | Disposition: A | Discharge status: Home / Self Care | Source: Ambulatory Visit | Attending: Urology | Admitting: Urology

## 2024-03-30 DIAGNOSIS — R109 Unspecified abdominal pain: Secondary | ICD-10-CM | POA: Diagnosis present

## 2024-03-30 DIAGNOSIS — N200 Calculus of kidney: Secondary | ICD-10-CM | POA: Diagnosis present

## 2024-03-30 DIAGNOSIS — N201 Calculus of ureter: Secondary | ICD-10-CM | POA: Insufficient documentation

## 2024-04-02 ENCOUNTER — Ambulatory Visit: Admitting: Urology

## 2024-04-02 ENCOUNTER — Encounter: Payer: Self-pay | Admitting: Urology

## 2024-04-02 VITALS — BP 122/84 | HR 70

## 2024-04-02 DIAGNOSIS — N201 Calculus of ureter: Secondary | ICD-10-CM | POA: Diagnosis not present

## 2024-04-02 DIAGNOSIS — N200 Calculus of kidney: Secondary | ICD-10-CM

## 2024-04-02 LAB — BLADDER SCAN AMB NON-IMAGING: Scan Result: 0

## 2024-04-02 MED ORDER — TAMSULOSIN HCL 0.4 MG PO CAPS: 0.4000 mg | ORAL_CAPSULE | Freq: Every day | ORAL | 0 refills | Status: DC

## 2024-04-02 NOTE — Progress Notes (Signed)
 Name: Monique Foster DOB: [birth year] MRN: [id number]  History of Present Illness: Ms. Monique Foster is a 48 y.o. female who presents today for follow up visit at [facility] . GU History includes: 1. Kidney stone x1.  > 02/13/2024: Seen in ER for right flank pain. CMP with normal renal function (GFR >60; creatinine 0.50). CBC with no leukocytosis (WBC 4.6). UA showed 0-5 WBC/hpf, >50 RBC/hpf, no bacteria. CT showed a 5 x 6 x 4 mm right UPJ stone with moderate right hydronephrosis. Additional bilateral nonobstructive micronephrolithiasis.  She was discharged with prescriptions for Flomax and Percocet 5-325 mg (#15 dispensed 02/13/2024 per PMP aware controlled substance registry).   At initial visit on 03/09/2024:  > 03/09/2024: KUB showed "5 mm calcific density projects over the right hemipelvis, likely distal right ureteral stone."  Since last visit: > 03/30/2024: KUB showed a distal right ureteral stone measuring 7 mm.  Today: She denies recent stone passage. She denies flank pain, fevers, nausea, or vomiting. Reports what sound like painful bladder spasms at the end of voiding.   She denies increased urinary urgency, frequency, nocturia, dysuria, gross hematuria, hesitancy, straining to void, or sensations of incomplete emptying.  Medications: Current Outpatient Medications  Medication Sig Dispense Refill   albuterol (VENTOLIN HFA) 108 (90 Base) MCG/ACT inhaler Inhale 2 puffs into the lungs every 4 (four) hours as needed for wheezing or shortness of breath.     atorvastatin (LIPITOR) 10 MG tablet Take 10 mg by mouth daily.     cetirizine (ZYRTEC) 10 MG tablet Take 10 mg by mouth daily.     cetirizine (ZYRTEC) 10 MG tablet Take 10 mg by mouth 2 (two) times daily.     Cholecalciferol (VITAMIN D3) 50 MCG (2000 UT) CAPS Take by mouth.     Coenzyme Q10 200 MG capsule Take 200 mg by mouth daily.     Continuous Glucose Sensor (FREESTYLE LIBRE 3 PLUS SENSOR) MISC Change sensor  every 15 days. 6 each 3   esomeprazole (NEXIUM) 20 MG capsule Take 20 mg by mouth daily.      esomeprazole (NEXIUM) 20 MG capsule Take 20 mg by mouth daily at 12 noon.     gemfibrozil (LOPID) 600 MG tablet Take 600 mg by mouth 2 (two) times daily.     Insulin Pen Needle (PEN NEEDLES) 31G X 5 MM MISC Use to inject insulin once daily 100 each 11   losartan (COZAAR) 25 MG tablet Take 25 mg by mouth daily.     metFORMIN (GLUCOPHAGE-XR) 500 MG 24 hr tablet Take 2 tablets (1,000 mg total) by mouth daily with breakfast. 180 tablet 3   Misc Natural Products (GLUCOSAMINE CHOND MSM FORMULA PO) Take by mouth. Patient states that she takes 2 a day     Multiple Vitamin (MULTIVITAMIN) capsule Take 1 capsule by mouth daily.     ondansetron (ZOFRAN) 4 MG tablet Take 1 tablet (4 mg total) by mouth every 8 (eight) hours as needed for nausea or vomiting. 20 tablet 0   OVER THE COUNTER MEDICATION AMbren daily for hot flashes     oxyCODONE-acetaminophen (PERCOCET/ROXICET) 5-325 MG tablet Take 1 tablet by mouth every 4 (four) hours as needed for severe pain (pain score 7-10). 15 tablet 0   RELION PEN NEEDLES 32G X 4 MM MISC      rosuvastatin (CRESTOR) 5 MG tablet Take 5 mg by mouth daily.     tirzepatide (MOUNJARO) 12.5 MG/0.5ML Pen Inject 12.5 mg into the skin once  a week. 6 mL 1   tamsulosin (FLOMAX) 0.4 MG CAPS capsule Take 1 capsule (0.4 mg total) by mouth daily. 14 capsule 0   No current facility-administered medications for this visit.    Allergies: Allergies  Allergen Reactions   Iodine Shortness Of Breath and Rash   Levofloxacin Swelling    Facial Area Facial Area   Shellfish Allergy Shortness Of Breath, Nausea And Vomiting and Rash   Shellfish-Derived Products Nausea And Vomiting, Rash and Shortness Of Breath   Latex Itching and Rash   Penicillins Rash    Past Medical History:  Diagnosis Date   Asthma    Diabetes mellitus without complication (HCC)    GERD (gastroesophageal reflux disease)     Past Surgical History:  Procedure Laterality Date   CESAREAN SECTION     TUBAL LIGATION     Family History  Problem Relation Age of Onset   Diabetes Mother    Osteoporosis Mother    Diabetes Father    Heart attack Father    Social History   Socioeconomic History   Marital status: Married    Spouse name: Not on file   Number of children: Not on file   Years of education: Not on file   Highest education level: Not on file  Occupational History   Not on file  Tobacco Use   Smoking status: Former   Smokeless tobacco: Never  Vaping Use   Vaping status: Never Used  Substance and Sexual Activity   Alcohol use: No   Drug use: No   Sexual activity: Not on file  Other Topics Concern   Not on file  Social History Narrative   Not on file   Social Drivers of Health   Financial Resource Strain: Not on file  Food Insecurity: Not on file  Transportation Needs: Not on file  Physical Activity: Not on file  Stress: Not on file  Social Connections: Not on file  Intimate Partner Violence: Not on file    SUBJECTIVE  Review of Systems Constitutional: Patient denies any unintentional weight loss or change in strength lntegumentary: Patient denies any rashes or pruritus Cardiovascular: Patient denies chest pain or syncope Respiratory: Patient denies shortness of breath Gastrointestinal: As per HPI Musculoskeletal: Patient denies muscle cramps or weakness Neurologic: Patient denies convulsions or seizures Allergic/Immunologic: Patient denies recent allergic reaction(s) Hematologic/Lymphatic: Patient denies bleeding tendencies Endocrine: Patient denies heat/cold intolerance  GU: As per HPI.  OBJECTIVE Vitals:   04/02/24 1610  BP: 122/84  Pulse: 70   There is no height or weight on file to calculate BMI.  Physical Examination Constitutional: No obvious distress; patient is non-toxic appearing  Cardiovascular: No visible lower extremity edema.  Respiratory: The  patient does not have audible wheezing/stridor; respirations do not appear labored  Gastrointestinal: Abdomen non-distended Musculoskeletal: Normal ROM of UEs  Skin: No obvious rashes/open sores  Neurologic: CN 2-12 grossly intact Psychiatric: Answered questions appropriately with normal affect  Hematologic/Lymphatic/Immunologic: No obvious bruises or sites of spontaneous bleeding  Urine microscopy: 6-10 WBC/hpf, 0-2 RBC/hpf, no bacteria  ASSESSMENT Right ureteral stone - Plan: BLADDER SCAN AMB NON-IMAGING, Urinalysis, Routine w reflex microscopic, Ambulatory Referral For Surgery Scheduling  Kidney stones - Plan: Ambulatory Referral For Surgery Scheduling, tamsulosin (FLOMAX) 0.4 MG CAPS capsule  We reviewed recent imaging results; right distal ureteral stone still present at UVJ. Agreed to proceed with ESWL. Procedure and potential risks discussed in detail. Will continue Flomax daily for now for MET; refill sent. Discussed OTC analgesics  for PRN use. She was advised to go to the ER if She develops fever >100.5 F, uncontrollable pain, or other significantly concerning symptoms.. Patient verbalized understanding of and agreement with current plan. All questions were answered.  PLAN Advised the following: Continue Flomax (Tamsulosin) 0.4 mg daily. Return for surgery.  Orders Placed This Encounter  Procedures   Urinalysis, Routine w reflex microscopic   Ambulatory Referral For Surgery Scheduling    Referral Priority:   Routine    Referral Type:   Consultation    Referred to Provider:   Malen Gauze, MD    Number of Visits Requested:   1   BLADDER SCAN AMB NON-IMAGING    It has been explained that the patient is to follow regularly with their PCP in addition to all other providers involved in their care and to follow instructions provided by these respective offices. Patient advised to contact urology clinic if any urologic-pertaining questions, concerns, new symptoms or problems  arise in the interim period.  There are no Patient Instructions on file for this visit.  Electronically signed by:  Donnita Falls, MSN, FNP-C, CUNP 04/02/2024 4:37 PM

## 2024-04-02 NOTE — H&P (View-Only) (Signed)
 Name: Monique Foster DOB: [birth year] MRN: [id number]  History of Present Illness: [name] is a 48 y.o. female who presents today for follow up visit at [facility] . GU History includes: 1. Kidney stone x1.  > 02/13/2024: Seen in ER for right flank pain. CMP with normal renal function (GFR >60; creatinine 0.50). CBC with no leukocytosis (WBC 4.6). UA showed 0-5 WBC/hpf, >50 RBC/hpf, no bacteria. CT showed a 5 x 6 x 4 mm right UPJ stone with moderate right hydronephrosis. Additional bilateral nonobstructive micronephrolithiasis.  She was discharged with prescriptions for Flomax and Percocet 5-325 mg (#15 dispensed 02/13/2024 per PMP aware controlled substance registry).   At initial visit on 03/09/2024:  > 03/09/2024: KUB showed "5 mm calcific density projects over the right hemipelvis, likely distal right ureteral stone."  Since last visit: > 03/30/2024: KUB showed a distal right ureteral stone measuring 7 mm.  Today: She denies recent stone passage. She denies flank pain, fevers, nausea, or vomiting. Reports what sound like painful bladder spasms at the end of voiding.   She denies increased urinary urgency, frequency, nocturia, dysuria, gross hematuria, hesitancy, straining to void, or sensations of incomplete emptying.  Medications: Current Outpatient Medications  Medication Sig Dispense Refill   albuterol (VENTOLIN HFA) 108 (90 Base) MCG/ACT inhaler Inhale 2 puffs into the lungs every 4 (four) hours as needed for wheezing or shortness of breath.     atorvastatin (LIPITOR) 10 MG tablet Take 10 mg by mouth daily.     cetirizine (ZYRTEC) 10 MG tablet Take 10 mg by mouth daily.     cetirizine (ZYRTEC) 10 MG tablet Take 10 mg by mouth 2 (two) times daily.     Cholecalciferol (VITAMIN D3) 50 MCG (2000 UT) CAPS Take by mouth.     Coenzyme Q10 200 MG capsule Take 200 mg by mouth daily.     Continuous Glucose Sensor (FREESTYLE LIBRE 3 PLUS SENSOR) MISC Change sensor  every 15 days. 6 each 3   esomeprazole (NEXIUM) 20 MG capsule Take 20 mg by mouth daily.      esomeprazole (NEXIUM) 20 MG capsule Take 20 mg by mouth daily at 12 noon.     gemfibrozil (LOPID) 600 MG tablet Take 600 mg by mouth 2 (two) times daily.     Insulin Pen Needle (PEN NEEDLES) 31G X 5 MM MISC Use to inject insulin once daily 100 each 11   losartan (COZAAR) 25 MG tablet Take 25 mg by mouth daily.     metFORMIN (GLUCOPHAGE-XR) 500 MG 24 hr tablet Take 2 tablets (1,000 mg total) by mouth daily with breakfast. 180 tablet 3   Misc Natural Products (GLUCOSAMINE CHOND MSM FORMULA PO) Take by mouth. Patient states that she takes 2 a day     Multiple Vitamin (MULTIVITAMIN) capsule Take 1 capsule by mouth daily.     ondansetron (ZOFRAN) 4 MG tablet Take 1 tablet (4 mg total) by mouth every 8 (eight) hours as needed for nausea or vomiting. 20 tablet 0   OVER THE COUNTER MEDICATION AMbren daily for hot flashes     oxyCODONE-acetaminophen (PERCOCET/ROXICET) 5-325 MG tablet Take 1 tablet by mouth every 4 (four) hours as needed for severe pain (pain score 7-10). 15 tablet 0   RELION PEN NEEDLES 32G X 4 MM MISC      rosuvastatin (CRESTOR) 5 MG tablet Take 5 mg by mouth daily.     tirzepatide (MOUNJARO) 12.5 MG/0.5ML Pen Inject 12.5 mg into the skin once  a week. 6 mL 1   tamsulosin (FLOMAX) 0.4 MG CAPS capsule Take 1 capsule (0.4 mg total) by mouth daily. 14 capsule 0   No current facility-administered medications for this visit.    Allergies: Allergies  Allergen Reactions   Iodine Shortness Of Breath and Rash   Levofloxacin Swelling    Facial Area Facial Area   Shellfish Allergy Shortness Of Breath, Nausea And Vomiting and Rash   Shellfish-Derived Products Nausea And Vomiting, Rash and Shortness Of Breath   Latex Itching and Rash   Penicillins Rash    Past Medical History:  Diagnosis Date   Asthma    Diabetes mellitus without complication (HCC)    GERD (gastroesophageal reflux disease)     Past Surgical History:  Procedure Laterality Date   CESAREAN SECTION     TUBAL LIGATION     Family History  Problem Relation Age of Onset   Diabetes Mother    Osteoporosis Mother    Diabetes Father    Heart attack Father    Social History   Socioeconomic History   Marital status: Married    Spouse name: Not on file   Number of children: Not on file   Years of education: Not on file   Highest education level: Not on file  Occupational History   Not on file  Tobacco Use   Smoking status: Former   Smokeless tobacco: Never  Vaping Use   Vaping status: Never Used  Substance and Sexual Activity   Alcohol use: No   Drug use: No   Sexual activity: Not on file  Other Topics Concern   Not on file  Social History Narrative   Not on file   Social Drivers of Health   Financial Resource Strain: Not on file  Food Insecurity: Not on file  Transportation Needs: Not on file  Physical Activity: Not on file  Stress: Not on file  Social Connections: Not on file  Intimate Partner Violence: Not on file    SUBJECTIVE  Review of Systems Constitutional: Patient denies any unintentional weight loss or change in strength lntegumentary: Patient denies any rashes or pruritus Cardiovascular: Patient denies chest pain or syncope Respiratory: Patient denies shortness of breath Gastrointestinal: As per HPI Musculoskeletal: Patient denies muscle cramps or weakness Neurologic: Patient denies convulsions or seizures Allergic/Immunologic: Patient denies recent allergic reaction(s) Hematologic/Lymphatic: Patient denies bleeding tendencies Endocrine: Patient denies heat/cold intolerance  GU: As per HPI.  OBJECTIVE Vitals:   04/02/24 1610  BP: 122/84  Pulse: 70   There is no height or weight on file to calculate BMI.  Physical Examination Constitutional: No obvious distress; patient is non-toxic appearing  Cardiovascular: No visible lower extremity edema.  Respiratory: The  patient does not have audible wheezing/stridor; respirations do not appear labored  Gastrointestinal: Abdomen non-distended Musculoskeletal: Normal ROM of UEs  Skin: No obvious rashes/open sores  Neurologic: CN 2-12 grossly intact Psychiatric: Answered questions appropriately with normal affect  Hematologic/Lymphatic/Immunologic: No obvious bruises or sites of spontaneous bleeding  Urine microscopy: 6-10 WBC/hpf, 0-2 RBC/hpf, no bacteria  ASSESSMENT Right ureteral stone - Plan: BLADDER SCAN AMB NON-IMAGING, Urinalysis, Routine w reflex microscopic, Ambulatory Referral For Surgery Scheduling  Kidney stones - Plan: Ambulatory Referral For Surgery Scheduling, tamsulosin (FLOMAX) 0.4 MG CAPS capsule  We reviewed recent imaging results; right distal ureteral stone still present at [facility]. Agreed to proceed with ESWL. Procedure and potential risks discussed in detail. Will continue Flomax daily for now for MET; refill sent. Discussed OTC analgesics  for PRN use. She was advised to go to the ER if She develops fever >100.5 F, uncontrollable pain, or other significantly concerning symptoms.. Patient verbalized understanding of and agreement with current plan. All questions were answered.  PLAN Advised the following: Continue Flomax (Tamsulosin) 0.4 mg daily. Return for surgery.  Orders Placed This Encounter  Procedures   Urinalysis, Routine w reflex microscopic   Ambulatory Referral For Surgery Scheduling    Referral Priority:   Routine    Referral Type:   Consultation    Referred to Provider:   Malen Gauze, MD    Number of Visits Requested:   1   BLADDER SCAN AMB NON-IMAGING    It has been explained that the patient is to follow regularly with their PCP in addition to all other providers involved in their care and to follow instructions provided by these respective offices. Patient advised to contact urology clinic if any urologic-pertaining questions, concerns, new symptoms or problems  arise in the interim period.  There are no Patient Instructions on file for this visit.  Electronically signed by:  Donnita Falls, MSN, FNP-C, CUNP 04/02/2024 4:37 PM

## 2024-04-03 ENCOUNTER — Other Ambulatory Visit: Payer: Self-pay

## 2024-04-03 DIAGNOSIS — N200 Calculus of kidney: Secondary | ICD-10-CM

## 2024-04-03 LAB — MICROSCOPIC EXAMINATION: Bacteria, UA: NONE SEEN

## 2024-04-03 LAB — URINALYSIS, ROUTINE W REFLEX MICROSCOPIC
Bilirubin, UA: NEGATIVE
Glucose, UA: NEGATIVE
Ketones, UA: NEGATIVE
Nitrite, UA: NEGATIVE
Protein,UA: NEGATIVE
RBC, UA: NEGATIVE
Specific Gravity, UA: 1.015 (ref 1.005–1.030)
Urobilinogen, Ur: 0.2 mg/dL (ref 0.2–1.0)
pH, UA: 6.5 (ref 5.0–7.5)

## 2024-04-09 ENCOUNTER — Encounter (HOSPITAL_COMMUNITY)
Admission: RE | Admit: 2024-04-09 | Discharge: 2024-04-09 | Disposition: A | Discharge status: Home / Self Care | Source: Ambulatory Visit | Attending: Urology | Admitting: Urology

## 2024-04-09 ENCOUNTER — Encounter (HOSPITAL_COMMUNITY): Payer: Self-pay

## 2024-04-09 ENCOUNTER — Other Ambulatory Visit: Payer: Self-pay

## 2024-04-14 ENCOUNTER — Encounter (HOSPITAL_COMMUNITY): Payer: Self-pay | Admitting: Urology

## 2024-04-14 ENCOUNTER — Ambulatory Visit (HOSPITAL_COMMUNITY)
Admission: RE | Admit: 2024-04-14 | Discharge: 2024-04-14 | Disposition: A | Discharge status: Home / Self Care | Attending: Urology | Admitting: Urology

## 2024-04-14 ENCOUNTER — Ambulatory Visit (HOSPITAL_COMMUNITY)

## 2024-04-14 ENCOUNTER — Encounter (HOSPITAL_COMMUNITY)
Admission: RE | Disposition: A | Discharge status: Home / Self Care | Payer: Self-pay | Source: Home / Self Care | Attending: Urology

## 2024-04-14 DIAGNOSIS — N200 Calculus of kidney: Secondary | ICD-10-CM

## 2024-04-14 DIAGNOSIS — E119 Type 2 diabetes mellitus without complications: Secondary | ICD-10-CM | POA: Diagnosis not present

## 2024-04-14 DIAGNOSIS — G4730 Sleep apnea, unspecified: Secondary | ICD-10-CM | POA: Insufficient documentation

## 2024-04-14 DIAGNOSIS — Z794 Long term (current) use of insulin: Secondary | ICD-10-CM | POA: Insufficient documentation

## 2024-04-14 DIAGNOSIS — E669 Obesity, unspecified: Secondary | ICD-10-CM | POA: Insufficient documentation

## 2024-04-14 DIAGNOSIS — Z79899 Other long term (current) drug therapy: Secondary | ICD-10-CM | POA: Diagnosis not present

## 2024-04-14 DIAGNOSIS — N132 Hydronephrosis with renal and ureteral calculous obstruction: Secondary | ICD-10-CM | POA: Diagnosis present

## 2024-04-14 DIAGNOSIS — Z7984 Long term (current) use of oral hypoglycemic drugs: Secondary | ICD-10-CM | POA: Insufficient documentation

## 2024-04-14 HISTORY — DX: Sleep apnea, unspecified: G47.30

## 2024-04-14 HISTORY — PX: EXTRACORPOREAL SHOCK WAVE LITHOTRIPSY: SHX1557

## 2024-04-14 LAB — GLUCOSE, CAPILLARY: Glucose-Capillary: 210 mg/dL — ABNORMAL HIGH (ref 70–99)

## 2024-04-14 SURGERY — LITHOTRIPSY, ESWL
Anesthesia: LOCAL | Laterality: Right

## 2024-04-14 MED ORDER — DIAZEPAM 5 MG PO TABS: 10.0000 mg | ORAL_TABLET | Freq: Once | ORAL | Status: AC

## 2024-04-14 MED ORDER — DIPHENHYDRAMINE HCL 25 MG PO CAPS
25.0000 mg | ORAL_CAPSULE | ORAL | Status: AC
  Administered 2024-04-14: 25 mg via ORAL
  Filled 2024-04-14: qty 1

## 2024-04-14 MED ORDER — DIAZEPAM 5 MG PO TABS
ORAL_TABLET | ORAL | Status: AC
  Administered 2024-04-14: 10 mg via ORAL
  Filled 2024-04-14: qty 2

## 2024-04-14 MED ORDER — ONDANSETRON HCL 4 MG PO TABS: 4.0000 mg | ORAL_TABLET | Freq: Three times a day (TID) | ORAL | 0 refills | Status: AC | PRN

## 2024-04-14 MED ORDER — OXYCODONE-ACETAMINOPHEN 5-325 MG PO TABS: 1.0000 | ORAL_TABLET | ORAL | 0 refills | Status: AC | PRN

## 2024-04-14 MED ORDER — SODIUM CHLORIDE 0.9 % IV SOLN: Freq: Once | INTRAVENOUS | Status: AC

## 2024-04-14 MED ORDER — TAMSULOSIN HCL 0.4 MG PO CAPS: 0.4000 mg | ORAL_CAPSULE | Freq: Every day | ORAL | 0 refills | Status: AC

## 2024-04-14 NOTE — Progress Notes (Signed)
 Pt was able to urinate, no issues noted.

## 2024-04-14 NOTE — Interval H&P Note (Signed)
 History and Physical Interval Note:  04/14/2024 8:46 AM  Monique Foster  has presented today for surgery, with the diagnosis of Right Ureteral Stone.  The various methods of treatment have been discussed with the patient and family. After consideration of risks, benefits and other options for treatment, the patient has consented to  Procedure(s): LITHOTRIPSY, ESWL (Right) as a surgical intervention.  The patient's history has been reviewed, patient examined, no change in status, stable for surgery.  I have reviewed the patient's chart and labs.  Questions were answered to the patient's satisfaction.     Johnie Nailer

## 2024-04-14 NOTE — Progress Notes (Signed)
 To whom it may Concern,  Ms  Monique Foster had surgery today and is unable to drive or make decisions for 24 hours. Evelene Hint RN, BSN, CNOR, CGRN

## 2024-04-15 ENCOUNTER — Encounter (HOSPITAL_COMMUNITY): Payer: Self-pay | Admitting: Urology

## 2024-04-16 ENCOUNTER — Encounter (HOSPITAL_COMMUNITY): Payer: Self-pay | Admitting: Urology

## 2024-04-30 ENCOUNTER — Ambulatory Visit (HOSPITAL_COMMUNITY)
Admission: RE | Admit: 2024-04-30 | Discharge: 2024-04-30 | Disposition: A | Discharge status: Home / Self Care | Source: Ambulatory Visit | Attending: Urology | Admitting: Urology

## 2024-04-30 DIAGNOSIS — N200 Calculus of kidney: Secondary | ICD-10-CM | POA: Insufficient documentation

## 2024-05-01 ENCOUNTER — Encounter: Payer: Self-pay | Admitting: Urology

## 2024-05-01 ENCOUNTER — Ambulatory Visit: Admitting: Urology

## 2024-05-01 VITALS — BP 116/76 | HR 77

## 2024-05-01 DIAGNOSIS — N200 Calculus of kidney: Secondary | ICD-10-CM

## 2024-05-01 LAB — URINALYSIS, ROUTINE W REFLEX MICROSCOPIC
Bilirubin, UA: NEGATIVE
Leukocytes,UA: NEGATIVE
Nitrite, UA: NEGATIVE
Protein,UA: NEGATIVE
RBC, UA: NEGATIVE
Specific Gravity, UA: 1.02 (ref 1.005–1.030)
Urobilinogen, Ur: 0.2 mg/dL (ref 0.2–1.0)
pH, UA: 7 (ref 5.0–7.5)

## 2024-05-01 NOTE — Patient Instructions (Signed)

## 2024-05-01 NOTE — Progress Notes (Signed)
 05/01/2024 12:05 PM   Monique Foster 1976/02/17 [id number]  Referring provider: Orest Bio, MD [name],  [place] [address]  No chief complaint on file.   HPI: Monique Foster is a 47yo here for followup after R ESWL. She passed multiple fragments. KUB today shows no distal ureteral calculi.    PMH: Past Medical History:  Diagnosis Date   Asthma    Diabetes mellitus without complication (HCC)    GERD (gastroesophageal reflux disease)    Sleep apnea     Surgical History: Past Surgical History:  Procedure Laterality Date   CESAREAN SECTION     EXTRACORPOREAL SHOCK WAVE LITHOTRIPSY Right 04/14/2024   Procedure: LITHOTRIPSY, ESWL;  Surgeon: Marco Severs, MD;  Location: [facility];  Service: Urology;  Laterality: Right;   TUBAL LIGATION      Home Medications:  Allergies as of 05/01/2024       Reactions   Iodine Shortness Of Breath, Rash   Levofloxacin Swelling   Facial Area Facial Area   Shellfish Allergy Shortness Of Breath, Nausea And Vomiting, Rash   Shellfish-derived Products Nausea And Vomiting, Rash, Shortness Of Breath   Latex Itching, Rash   Penicillins Rash        Medication List        Accurate as of May 01, 2024 12:05 PM. If you have any questions, ask your nurse or doctor.          albuterol  108 (90 Base) MCG/ACT inhaler Commonly known as: VENTOLIN  HFA Inhale 2 puffs into the lungs every 4 (four) hours as needed for wheezing or shortness of breath.   atorvastatin 10 MG tablet Commonly known as: LIPITOR Take 10 mg by mouth daily.   cetirizine 10 MG tablet Commonly known as: ZYRTEC Take 10 mg by mouth daily.   cetirizine 10 MG tablet Commonly known as: ZYRTEC Take 10 mg by mouth 2 (two) times daily.   Coenzyme Q10 200 MG capsule Take 200 mg by mouth daily.   esomeprazole 20 MG capsule Commonly known as: NEXIUM Take 20 mg by mouth daily.   esomeprazole 20 MG capsule Commonly known as: NEXIUM Take 20 mg by mouth  daily at 12 noon.   FreeStyle Libre 3 Plus Sensor Misc Change sensor every 15 days.   gemfibrozil 600 MG tablet Commonly known as: LOPID Take 600 mg by mouth 2 (two) times daily.   GLUCOSAMINE CHOND MSM FORMULA PO Take by mouth. Patient states that she takes 2 a day   losartan 25 MG tablet Commonly known as: COZAAR Take 25 mg by mouth daily.   metFORMIN  500 MG 24 hr tablet Commonly known as: GLUCOPHAGE -XR Take 2 tablets (1,000 mg total) by mouth daily with breakfast.   multivitamin capsule Take 1 capsule by mouth daily.   ondansetron  4 MG tablet Commonly known as: Zofran  Take 1 tablet (4 mg total) by mouth every 8 (eight) hours as needed for nausea or vomiting.   OVER THE COUNTER MEDICATION AMbren daily for hot flashes   oxyCODONE -acetaminophen  5-325 MG tablet Commonly known as: PERCOCET/ROXICET Take 1 tablet by mouth every 4 (four) hours as needed for severe pain (pain score 7-10).   Pen Needles 31G X 5 MM Misc Use to inject insulin once daily   ReliOn Pen Needles 32G X 4 MM Misc Generic drug: Insulin Pen Needle   rosuvastatin 5 MG tablet Commonly known as: CRESTOR Take 5 mg by mouth daily.   tamsulosin  0.4 MG  Caps capsule Commonly known as: FLOMAX  Take 1 capsule (0.4 mg total) by mouth daily.   tirzepatide  12.5 MG/0.5ML Pen Commonly known as: MOUNJARO  Inject 12.5 mg into the skin once a week.   vitamin D3 50 MCG (2000 UT) Caps Take by mouth.        Allergies:  Allergies  Allergen Reactions   Iodine Shortness Of Breath and Rash   Levofloxacin Swelling    Facial Area Facial Area   Shellfish Allergy Shortness Of Breath, Nausea And Vomiting and Rash   Shellfish-Derived Products Nausea And Vomiting, Rash and Shortness Of Breath   Latex Itching and Rash   Penicillins Rash    Family History: Family History  Problem Relation Age of Onset   Diabetes Mother    Osteoporosis Mother    Diabetes Father    Heart attack Father     Social History:   reports that she has quit smoking. She has never used smokeless tobacco. She reports that she does not drink alcohol  and does not use drugs.  ROS: All other review of systems were reviewed and are negative except what is noted above in HPI  Physical Exam: BP 116/76   Pulse 77   LMP 05/01/2021   Constitutional:  Alert and oriented, No acute distress. HEENT: [place] AT, moist mucus membranes.  Trachea midline, no masses. Cardiovascular: No clubbing, cyanosis, or edema. Respiratory: Normal respiratory effort, no increased work of breathing. GI: Abdomen is soft, nontender, nondistended, no abdominal masses GU: No CVA tenderness.  Lymph: No cervical or inguinal lymphadenopathy. Skin: No rashes, bruises or suspicious lesions. Neurologic: Grossly intact, no focal deficits, moving all 4 extremities. Psychiatric: Normal mood and affect.  Laboratory Data: Lab Results  Component Value Date   WBC 8.1 02/21/2024   HGB 14.0 02/21/2024   HCT 40.2 02/21/2024   MCV 82.2 02/21/2024   PLT 156 02/21/2024    Lab Results  Component Value Date   CREATININE 0.73 02/27/2024    No results found for: "PSA"  No results found for: "TESTOSTERONE"  Lab Results  Component Value Date   HGBA1C 7.6 (A) 03/05/2024    Urinalysis    Component Value Date/Time   COLORURINE YELLOW 02/21/2024 2127   APPEARANCEUR Clear 04/02/2024 1607   LABSPEC 1.014 02/21/2024 2127   PHURINE 6.0 02/21/2024 2127   GLUCOSEU Negative 04/02/2024 1607   HGBUR LARGE (A) 02/21/2024 2127   BILIRUBINUR Negative 04/02/2024 1607   KETONESUR NEGATIVE 02/21/2024 2127   PROTEINUR Negative 04/02/2024 1607   PROTEINUR 30 (A) 02/21/2024 2127   UROBILINOGEN 0.2 12/21/2011 2226   NITRITE Negative 04/02/2024 1607   NITRITE NEGATIVE 02/21/2024 2127   LEUKOCYTESUR Trace (A) 04/02/2024 1607   LEUKOCYTESUR NEGATIVE 02/21/2024 2127    Lab Results  Component Value Date   LABMICR See below: 04/02/2024   WBCUA 6-10 (A) 04/02/2024   LABEPIT  0-10 04/02/2024   BACTERIA None seen 04/02/2024    Pertinent Imaging:  Results for orders placed during the hospital encounter of 04/30/24  DG Abd 1 View  Narrative CLINICAL DATA:  Kidney stone.  Patient reports recent lithotripsy.  EXAM: ABDOMEN - 1 VIEW  COMPARISON:  Radiograph 04/14/2024.  CT 02/13/2024  FINDINGS: Previous calcification in the pelvis is not definitively seen on the current exam, although stool and bowel gas overlie the region of previous calcification. No stones project over the renal beds. Moderate to large colonic stool burden without evidence of obstruction. There is also moderate ingested material in the stomach. Multiple scoliotic  curvature of the spine.  IMPRESSION: 1. Previous calcification in the pelvis is not definitively seen on the current exam, although stool and bowel gas overlie the region of previous calcification. No stones project over the renal beds. 2. Moderate to large colonic stool burden without evidence of obstruction.   Electronically Signed By: Chadwick Colonel M.D. On: 04/30/2024 17:57  No results found for this or any previous visit.  No results found for this or any previous visit.  No results found for this or any previous visit.  No results found for this or any previous visit.  No results found for this or any previous visit.  No results found for this or any previous visit.  Results for orders placed during the hospital encounter of 02/13/24  CT Renal Stone Study  Narrative CLINICAL DATA:  Right flank pain and pink tinged urine.  EXAM: CT ABDOMEN AND PELVIS WITHOUT CONTRAST  TECHNIQUE: Multidetector CT imaging of the abdomen and pelvis was performed following the standard protocol without IV contrast.  RADIATION DOSE REDUCTION: This exam was performed according to the departmental dose-optimization program which includes automated exposure control, adjustment of the mA and/or kV according to patient  size and/or use of iterative reconstruction technique.  COMPARISON:  CT without contrast 12/22/2011.  FINDINGS: Lower chest: No abnormality.  Hepatobiliary: Enlarged liver, measures 24 cm length with moderate to severe steatosis, similar enlargement on prior study.  Gallbladder is contracted and not well seen but some images suggest possibility of pericholecystic edema which may be seen with cholecystitis.  No calcified gallstones or biliary dilatation. Consider ultrasound for further evaluation.  Pancreas: Unremarkable without contrast.  Spleen: Mildly prominent measuring 13.5 cm AP. Several or size to the 2012 exam. No focal abnormality.  Adrenals/Urinary Tract: There is no adrenal mass. Again noted is a Bosniak 1 cyst in the posterior lower right kidney measuring 1.6 cm and 2.5 Hounsfield units. No follow-up imaging is needed. No interval change.  There is no other contour deforming abnormality of either unenhanced kidneys.  There are occasional punctate nonobstructive caliceal stones within both kidneys.  On the right there is moderate hydronephrosis due to a 5 x 6 x 4 mm UPJ stone.  The bilateral ureters are otherwise clear. The bladder is unremarkable for its degree of distention.  Stomach/Bowel: No dilatation or wall thickening including the retrocecal appendix.  Vascular/Lymphatic: There is a circumaortic left renal vein. No significant vascular findings are present. No enlarged abdominal or pelvic lymph nodes.  Reproductive: Uterus and bilateral adnexa are unremarkable.  Other: No abdominal wall hernia or abnormality. No abdominopelvic ascites.  Musculoskeletal: There are degenerative changes and mild levoscoliosis of the lumbar spine. Thoracic spondylosis. No acute or significant osseous findings.  IMPRESSION: 1. Moderate right hydronephrosis due to a 5 x 6 x 4 mm UPJ stone. 2. Bilateral nonobstructive micronephrolithiasis. 3. Hepatomegaly with  moderate to severe steatosis. Mild splenomegaly. 4. Contracted gallbladder but some images suggest possibility of pericholecystic edema which may be seen with cholecystitis. No calcified gallstones or biliary dilatation. Consider ultrasound for further evaluation.   Electronically Signed By: Denman Fischer M.D. On: 02/13/2024 07:39   Assessment & Plan:    1. Kidney stones (Primary) Dietary handout given Followup 6 months with [facility] - Urinalysis, Routine w reflex microscopic - Stone Analysis   No follow-ups on file.  Johnie Nailer, MD  [facility] [place]

## 2024-05-12 LAB — STONE ANALYSIS
Calcium Oxalate Dihydrate: 10 %
Calcium Oxalate Monohydrate: 90 %
Weight Calculi: 52 mg

## 2024-07-09 ENCOUNTER — Encounter: Payer: Self-pay | Admitting: Nurse Practitioner

## 2024-07-09 ENCOUNTER — Ambulatory Visit: Admitting: Nurse Practitioner

## 2024-07-09 VITALS — BP 102/70 | HR 90 | Ht 63.0 in | Wt 204.2 lb

## 2024-07-09 DIAGNOSIS — Z794 Long term (current) use of insulin: Secondary | ICD-10-CM

## 2024-07-09 DIAGNOSIS — I10 Essential (primary) hypertension: Secondary | ICD-10-CM

## 2024-07-09 DIAGNOSIS — Z7984 Long term (current) use of oral hypoglycemic drugs: Secondary | ICD-10-CM | POA: Diagnosis not present

## 2024-07-09 DIAGNOSIS — E782 Mixed hyperlipidemia: Secondary | ICD-10-CM

## 2024-07-09 DIAGNOSIS — E119 Type 2 diabetes mellitus without complications: Secondary | ICD-10-CM

## 2024-07-09 DIAGNOSIS — Z7985 Long-term (current) use of injectable non-insulin antidiabetic drugs: Secondary | ICD-10-CM

## 2024-07-09 LAB — POCT GLYCOSYLATED HEMOGLOBIN (HGB A1C)
Hemoglobin A1C: 7.5 % — AB (ref 4.0–5.6)
Hemoglobin A1C: 7.6 % — AB (ref 4.0–5.6)

## 2024-07-09 MED ORDER — TIRZEPATIDE 12.5 MG/0.5ML ~~LOC~~ SOAJ: 12.5000 mg | SUBCUTANEOUS | 1 refills | Status: DC

## 2024-07-09 NOTE — Progress Notes (Signed)
 Endocrinology Follow Up Note       07/09/2024, 4:26 PM   Subjective:    Patient ID: Monique Foster, female    DOB: 01/05/1976.  Monique Foster is being seen in follow up after being seen in consultation for management of currently uncontrolled symptomatic diabetes requested by  Atilano Deward ORN, MD.   Past Medical History:  Diagnosis Date   Asthma    Diabetes mellitus without complication (HCC)    GERD (gastroesophageal reflux disease)    Sleep apnea     Past Surgical History:  Procedure Laterality Date   CESAREAN SECTION     EXTRACORPOREAL SHOCK WAVE LITHOTRIPSY Right 04/14/2024   Procedure: LITHOTRIPSY, ESWL;  Surgeon: Sherrilee Belvie LITTIE, MD;  Location: [facility];  Service: Urology;  Laterality: Right;   TUBAL LIGATION      Social History   Socioeconomic History   Marital status: Married    Spouse name: Not on file   Number of children: Not on file   Years of education: Not on file   Highest education level: Not on file  Occupational History   Not on file  Tobacco Use   Smoking status: Former   Smokeless tobacco: Never  Vaping Use   Vaping status: Never Used  Substance and Sexual Activity   Alcohol  use: No   Drug use: No   Sexual activity: Not on file  Other Topics Concern   Not on file  Social History Narrative   Not on file   Social Drivers of Health   Financial Resource Strain: Not on file  Food Insecurity: Not on file  Transportation Needs: Not on file  Physical Activity: Not on file  Stress: Not on file  Social Connections: Not on file    Family History  Problem Relation Age of Onset   Diabetes Mother    Osteoporosis Mother    Diabetes Father    Heart attack Father     Outpatient Encounter Medications as of 07/09/2024  Medication Sig   albuterol  (VENTOLIN  HFA) 108 (90 Base) MCG/ACT inhaler Inhale 2 puffs into the lungs every 4 (four) hours as needed for wheezing or shortness  of breath.   atorvastatin (LIPITOR) 10 MG tablet Take 10 mg by mouth daily.   cetirizine (ZYRTEC) 10 MG tablet Take 10 mg by mouth daily.   cetirizine (ZYRTEC) 10 MG tablet Take 10 mg by mouth 2 (two) times daily.   Cholecalciferol (VITAMIN D3) 50 MCG (2000 UT) CAPS Take by mouth.   Coenzyme Q10 200 MG capsule Take 200 mg by mouth daily.   Continuous Glucose Sensor (FREESTYLE LIBRE 3 PLUS SENSOR) MISC Change sensor every 15 days.   esomeprazole (NEXIUM) 20 MG capsule Take 20 mg by mouth daily.    esomeprazole (NEXIUM) 20 MG capsule Take 20 mg by mouth daily at 12 noon.   gemfibrozil (LOPID) 600 MG tablet Take 600 mg by mouth 2 (two) times daily.   Insulin Pen Needle (PEN NEEDLES) 31G X 5 MM MISC Use to inject insulin once daily   losartan (COZAAR) 25 MG tablet Take 25 mg by mouth daily.   metFORMIN  (GLUCOPHAGE -XR) 500 MG 24 hr tablet Take 2 tablets (  1,000 mg total) by mouth daily with breakfast.   Misc Natural Products (GLUCOSAMINE CHOND MSM FORMULA PO) Take by mouth. Patient states that she takes 2 a day   Multiple Vitamin (MULTIVITAMIN) capsule Take 1 capsule by mouth daily.   ondansetron  (ZOFRAN ) 4 MG tablet Take 1 tablet (4 mg total) by mouth every 8 (eight) hours as needed for nausea or vomiting.   OVER THE COUNTER MEDICATION AMbren daily for hot flashes   oxyCODONE -acetaminophen  (PERCOCET/ROXICET) 5-325 MG tablet Take 1 tablet by mouth every 4 (four) hours as needed for severe pain (pain score 7-10).   RELION PEN NEEDLES 32G X 4 MM MISC    rosuvastatin (CRESTOR) 5 MG tablet Take 5 mg by mouth daily.   tamsulosin  (FLOMAX ) 0.4 MG CAPS capsule Take 1 capsule (0.4 mg total) by mouth daily.   [DISCONTINUED] tirzepatide  (MOUNJARO ) 12.5 MG/0.5ML Pen Inject 12.5 mg into the skin once a week.   tirzepatide  (MOUNJARO ) 12.5 MG/0.5ML Pen Inject 12.5 mg into the skin once a week.   No facility-administered encounter medications on file as of 07/09/2024.    ALLERGIES: Allergies  Allergen  Reactions   Iodine Shortness Of Breath and Rash   Levofloxacin Swelling    Facial Area Facial Area   Shellfish Allergy Shortness Of Breath, Nausea And Vomiting and Rash   Shellfish-Derived Products Nausea And Vomiting, Rash and Shortness Of Breath   Latex Itching and Rash   Penicillins Rash    VACCINATION STATUS:  There is no immunization history on file for this patient.  Diabetes She presents for her follow-up diabetic visit. She has type 2 diabetes mellitus. Onset time: Diagnosed at approx age of 28. Her disease course has been stable. There are no hypoglycemic associated symptoms. Pertinent negatives for diabetes include no fatigue. There are no hypoglycemic complications. Symptoms are stable. There are no diabetic complications. Risk factors for coronary artery disease include diabetes mellitus, dyslipidemia, obesity and sedentary lifestyle. Current diabetic treatment includes oral agent (monotherapy) (and Mounjaro ). She is compliant with treatment most of the time. Her weight is decreasing steadily. She is following a generally unhealthy diet. When asked about meal planning, she reported none. She has not had a previous visit with a dietitian. She participates in exercise intermittently. Her overall blood glucose range is >200 mg/dl. (She presents today with her CGM showing slightly above target glycemic profile overall.  Her POCT A1c today is 7.5%, improving from last visit of 7.6%.  Analysis of her CGM shows TIR 40%, TAR 60%, TBR 0% with a GMI of 8.2%.   She denies any hypoglycemia.  She notes she just recently got back from vacation and did cheat on her diet at that time.  She did have a kidney stone in May as well.) An ACE inhibitor/angiotensin II receptor blocker is being taken. She does not see a podiatrist.Eye exam is current.  Hyperlipidemia This is a chronic problem. The current episode started more than 1 year ago. The problem is uncontrolled. Recent lipid tests were reviewed and  are variable. Exacerbating diseases include diabetes and obesity. Factors aggravating her hyperlipidemia include fatty foods. Current antihyperlipidemic treatment includes statins. The current treatment provides mild improvement of lipids. Compliance problems include adherence to diet and adherence to exercise.  Risk factors for coronary artery disease include diabetes mellitus, dyslipidemia, obesity and hypertension.  Hypertension This is a chronic problem. The current episode started more than 1 year ago. The problem is unchanged. The problem is controlled. There are no associated agents to  hypertension. Risk factors for coronary artery disease include diabetes mellitus, dyslipidemia and obesity. Past treatments include angiotensin blockers. Compliance problems include diet and exercise.  Identifiable causes of hypertension include sleep apnea.    Review of systems  Constitutional: + decreasing body weight,  current Body mass index is 36.17 kg/m. , no fatigue, no subjective hyperthermia, no subjective hypothermia Eyes: no blurry vision, no xerophthalmia ENT: no sore throat, no nodules palpated in throat, no dysphagia/odynophagia, no hoarseness Cardiovascular: no chest pain, no shortness of breath, no palpitations, no leg swelling Respiratory: no cough, no shortness of breath Gastrointestinal: + mild nausea right after Mounjaro  injection, no vomiting/diarrhea Musculoskeletal: no muscle/joint aches Skin: no rashes, no hyperemia Neurological: no tremors, no numbness, no tingling, no dizziness Psychiatric: no depression, no anxiety   Objective:     BP 102/70 (BP Location: Left Arm, Patient Position: Sitting, Cuff Size: Large)   Pulse 90   Ht 5' 3 (1.6 m)   Wt 204 lb 3.2 oz (92.6 kg)   LMP 05/01/2021   BMI 36.17 kg/m   Wt Readings from Last 3 Encounters:  07/09/24 204 lb 3.2 oz (92.6 kg)  04/09/24 206 lb (93.4 kg)  03/05/24 206 lb 9.6 oz (93.7 kg)     BP Readings from Last 3  Encounters:  07/09/24 102/70  05/01/24 116/76  04/14/24 126/88      Physical Exam- Limited  Constitutional:  Body mass index is 36.17 kg/m. , not in acute distress, normal state of mind Eyes:  EOMI, no exophthalmos Musculoskeletal: no gross deformities, strength intact in all four extremities, no gross restriction of joint movements Skin:  no rashes, no hyperemia Neurological: no tremor with outstretched hands    Diabetic Foot Exam - Simple   No data filed     CMP ( most recent) CMP     Component Value Date/Time   NA 143 02/27/2024 0828   K 4.3 02/27/2024 0828   CL 101 02/27/2024 0828   CO2 26 02/27/2024 0828   GLUCOSE 137 (H) 02/27/2024 0828   GLUCOSE 221 (H) 02/21/2024 1914   BUN 17 02/27/2024 0828   CREATININE 0.73 02/27/2024 0828   CALCIUM 10.1 02/27/2024 0828   PROT 7.0 02/27/2024 0828   ALBUMIN 4.5 02/27/2024 0828   AST 31 02/27/2024 0828   ALT 38 (H) 02/27/2024 0828   ALKPHOS 59 02/27/2024 0828   BILITOT 0.8 02/27/2024 0828   GFRNONAA >60 02/21/2024 1914   GFRAA >90 04/15/2012 0247     Diabetic Labs (most recent): Lab Results  Component Value Date   HGBA1C 7.5 (A) 07/09/2024   HGBA1C 7.6 (A) 07/09/2024   HGBA1C 7.6 (A) 03/05/2024   MICROALBUR 80 mg/L 11/05/2023   MICROALBUR 10 mg /L 10/31/2022   MICROALBUR 30 10/18/2021     Lipid Panel ( most recent) Lipid Panel     Component Value Date/Time   CHOL 219 (H) 02/27/2024 0828   TRIG 184 (H) 02/27/2024 0828   HDL 37 (L) 02/27/2024 0828   CHOLHDL 5.9 (H) 02/27/2024 0828   LDLCALC 148 (H) 02/27/2024 0828   LABVLDL 34 02/27/2024 0828      Lab Results  Component Value Date   TSH 1.210 02/27/2024   TSH 1.140 10/25/2022   TSH 1.330 10/16/2021   FREET4 1.35 02/27/2024   FREET4 1.09 10/25/2022   FREET4 1.17 10/16/2021           Assessment & Plan:   1) Type 2 diabetes mellitus without complication, without long-term current use  of insulin (HCC)  She presents today with her CGM showing  slightly above target glycemic profile overall.  Her POCT A1c today is 7.5%, improving from last visit of 7.6%.  Analysis of her CGM shows TIR 40%, TAR 60%, TBR 0% with a GMI of 8.2%.   She denies any hypoglycemia.  She notes she just recently got back from vacation and did cheat on her diet at that time.  She did have a kidney stone in May as well.  - Monique Foster has currently uncontrolled symptomatic type 2 DM since 48 years of age.  -Recent labs reviewed.    - I had a long discussion with her about the progressive nature of diabetes and the pathology behind its complications. -her diabetes is not currently complicated but she remains at a high risk for more acute and chronic complications which include CAD, CVA, CKD, retinopathy, and neuropathy. These are all discussed in detail with her.  The following Lifestyle Medicine recommendations according to American College of Lifestyle Medicine [employer]) were discussed and offered to patient and she agrees to start the journey:  A. Whole Foods, Plant-based plate comprising of fruits and vegetables, plant-based proteins, whole-grain carbohydrates was discussed in detail with the patient.   A list for source of those nutrients were also provided to the patient.  Patient will use only water or unsweetened tea for hydration. B.  The need to stay away from risky substances including alcohol , smoking; obtaining 7 to 9 hours of restorative sleep, at least 150 minutes of moderate intensity exercise weekly, the importance of healthy social connections,  and stress reduction techniques were discussed. C.  A full color page of  Calorie density of various food groups per pound showing examples of each food groups was provided to the patient.  - Nutritional counseling repeated at each appointment due to patients tendency to fall back in to old habits.  - The patient admits there is a room for improvement in their diet and drink choices. -  Suggestion is made for the  patient to avoid simple carbohydrates from their diet including Cakes, Sweet Desserts / Pastries, Ice Cream, Soda (diet and regular), Sweet Tea, Candies, Chips, Cookies, Sweet Pastries, Store Bought Juices, Alcohol  in Excess of 1-2 drinks a day, Artificial Sweeteners, Coffee Creamer, and Sugar-free Products. This will help patient to have stable blood glucose profile and potentially avoid unintended weight gain.   - I encouraged the patient to switch to unprocessed or minimally processed complex starch and increased protein intake (animal or plant source), fruits, and vegetables.   - Patient is advised to stick to a routine mealtimes to eat 3 meals a day and avoid unnecessary snacks (to snack only to correct hypoglycemia).  - she will be scheduled with Santana Duke, RDN, CDE for diabetes education.  - I have approached her with the following individualized plan to manage  her diabetes and patient agrees:   -She is advised to continue Mounjaro  12.5 mg SQ weekly and Metformin  1000 mg ER daily after breakfast.   -She is encouraged to continue monitoring blood glucose at least twice daily (using her CGM), before breakfast and before bed, and to call the clinic if she has readings less than 70 or greater than 300 for 3 tests in a row.    - Specific targets for  A1c;  LDL, HDL,  and Triglycerides were discussed with the patient.  2) Blood Pressure /Hypertension:  her blood pressure is controlled to target.  She  is advised to continue Losartan 25 mg po daily.  3) Lipids/Hyperlipidemia:    Her most recent lipid panel from 02/27/24 shows elevated LDL of 148 and elevated but significantly improved triglycerides of 184.  She is advised to continue Lipitor 10 mg po daily at bedtime and Gemfibrozil 600 mg po twice daily.  Side effects and precautions discussed with her.  She is advised to avoid fried foods and butter.    4)  Weight/Diet:  her Body mass index is 36.17 kg/m.  -  clearly complicating her  diabetes care.   she is a candidate for weight loss. I discussed with her the fact that loss of 5 - 10% of her  current body weight will have the most impact on her diabetes management.  Exercise, and detailed carbohydrates information provided  -  detailed on discharge instructions.  5) Vitamin D  deficiency Her recent vitamin d  level was 26.3.  Will recheck prior to next visit.  6) Chronic Care/Health Maintenance: -she is on Statin medications and is encouraged to initiate and continue to follow up with Ophthalmology, Dentist, Podiatrist at least yearly or according to recommendations, and advised to stay away from smoking. I have recommended yearly flu vaccine and pneumonia vaccine at least every 5 years; moderate intensity exercise for up to 150 minutes weekly; and sleep for at least 7 hours a day.  - she is advised to maintain close follow up with Sasser, Deward ORN, MD for primary care needs, as well as her other providers for optimal and coordinated care.     I spent  22  minutes in the care of the patient today including review of labs from CMP, Lipids, Thyroid  Function, Hematology (current and previous including abstractions from other facilities); face-to-face time discussing  her blood glucose readings/logs, discussing hypoglycemia and hyperglycemia episodes and symptoms, medications doses, her options of short and long term treatment based on the latest standards of care / guidelines;  discussion about incorporating lifestyle medicine;  and documenting the encounter. Risk reduction counseling performed per USPSTF guidelines to reduce obesity and cardiovascular risk factors.     Please refer to Patient Instructions for Blood Glucose Monitoring and Insulin/Medications Dosing Guide  in media tab for additional information. Please  also refer to  Patient Self Inventory in the Media  tab for reviewed elements of pertinent patient history.  Monique Foster participated in the discussions,  expressed understanding, and voiced agreement with the above plans.  All questions were answered to her satisfaction. she is encouraged to contact clinic should she have any questions or concerns prior to her return visit.    Follow up plan: - Return in about 4 months (around 11/09/2024) for Diabetes F/U with A1c in office, No previsit labs.   Benton Rio, [facility] [address] [place], [place] [address] Phone: [phone] Fax: [phone]  07/09/2024, 4:26 PM

## 2024-10-30 ENCOUNTER — Ambulatory Visit (HOSPITAL_COMMUNITY)
Admission: RE | Admit: 2024-10-30 | Discharge: 2024-10-30 | Disposition: A | Discharge status: Home / Self Care | Source: Ambulatory Visit | Attending: Urology | Admitting: Urology

## 2024-10-30 DIAGNOSIS — N200 Calculus of kidney: Secondary | ICD-10-CM | POA: Diagnosis present

## 2024-11-02 ENCOUNTER — Ambulatory Visit: Admitting: Urology

## 2024-11-02 ENCOUNTER — Encounter: Payer: Self-pay | Admitting: Urology

## 2024-11-02 VITALS — BP 115/73 | HR 76

## 2024-11-02 DIAGNOSIS — N200 Calculus of kidney: Secondary | ICD-10-CM

## 2024-11-02 NOTE — Progress Notes (Unsigned)
 11/02/2024 4:14 PM   Monique Foster Monique Foster 05-Jan-1976 [id number]  Referring provider: Atilano Deward ORN, MD [address] [place],  [place] [address]  nephrolithiasis   HPI: [name] is a 48yo here for followup for nephrolithiasis. No stone events since last visit. She has intermittent urgency to urinate. KUb shows possible 2mm bladder stone on the left side of her bladder.    PMH: Past Medical History:  Diagnosis Date   Asthma    Diabetes mellitus without complication (HCC)    GERD (gastroesophageal reflux disease)    Sleep apnea     Surgical History: Past Surgical History:  Procedure Laterality Date   CESAREAN SECTION     EXTRACORPOREAL SHOCK WAVE LITHOTRIPSY Right 04/14/2024   Procedure: LITHOTRIPSY, ESWL;  Surgeon: Sherrilee Belvie LITTIE, MD;  Location: [facility];  Service: Urology;  Laterality: Right;   TUBAL LIGATION      Home Medications:  Allergies as of 11/02/2024       Reactions   Iodine Shortness Of Breath, Rash   Levofloxacin Swelling   Facial Area Facial Area   Shellfish Allergy Shortness Of Breath, Nausea And Vomiting, Rash   Shellfish Protein-containing Drug Products Nausea And Vomiting, Rash, Shortness Of Breath   Latex Itching, Rash   Penicillins Rash        Medication List        Accurate as of November 02, 2024  4:14 PM. If you have any questions, ask your nurse or doctor.          albuterol  108 (90 Base) MCG/ACT inhaler Commonly known as: VENTOLIN  HFA Inhale 2 puffs into the lungs every 4 (four) hours as needed for wheezing or shortness of breath.   atorvastatin 10 MG tablet Commonly known as: LIPITOR Take 10 mg by mouth daily.   cetirizine 10 MG tablet Commonly known as: ZYRTEC Take 10 mg by mouth daily.   cetirizine 10 MG tablet Commonly known as: ZYRTEC Take 10 mg by mouth 2 (two) times daily.   Coenzyme Q10 200 MG capsule Take 200 mg by mouth daily.   esomeprazole 20 MG capsule Commonly known as: NEXIUM Take 20 mg by mouth  daily.   esomeprazole 20 MG capsule Commonly known as: NEXIUM Take 20 mg by mouth daily at 12 noon.   FreeStyle Libre 3 Plus Sensor Misc Change sensor every 15 days.   gemfibrozil 600 MG tablet Commonly known as: LOPID Take 600 mg by mouth 2 (two) times daily.   GLUCOSAMINE CHOND MSM FORMULA PO Take by mouth. Patient states that she takes 2 a day   losartan 25 MG tablet Commonly known as: COZAAR Take 25 mg by mouth daily.   metFORMIN  500 MG 24 hr tablet Commonly known as: GLUCOPHAGE -XR Take 2 tablets (1,000 mg total) by mouth daily with breakfast.   multivitamin capsule Take 1 capsule by mouth daily.   ondansetron  4 MG tablet Commonly known as: Zofran  Take 1 tablet (4 mg total) by mouth every 8 (eight) hours as needed for nausea or vomiting.   OVER THE COUNTER MEDICATION AMbren daily for hot flashes   oxyCODONE -acetaminophen  5-325 MG tablet Commonly known as: PERCOCET/ROXICET Take 1 tablet by mouth every 4 (four) hours as needed for severe pain (pain score 7-10).   Pen Needles 31G X 5 MM Misc Use to inject insulin once daily   ReliOn Pen Needles 32G X 4 MM Misc Generic drug: Insulin Pen Needle   rosuvastatin 5 MG tablet Commonly known as:  CRESTOR Take 5 mg by mouth daily.   tamsulosin  0.4 MG Caps capsule Commonly known as: FLOMAX  Take 1 capsule (0.4 mg total) by mouth daily.   tirzepatide  12.5 MG/0.5ML Pen Commonly known as: MOUNJARO  Inject 12.5 mg into the skin once a week.   vitamin D3 50 MCG (2000 UT) Caps Take by mouth.        Allergies:  Allergies  Allergen Reactions   Iodine Shortness Of Breath and Rash   Levofloxacin Swelling    Facial Area Facial Area   Shellfish Allergy Shortness Of Breath, Nausea And Vomiting and Rash   Shellfish Protein-Containing Drug Products Nausea And Vomiting, Rash and Shortness Of Breath   Latex Itching and Rash   Penicillins Rash    Family History: Family History  Problem Relation Age of Onset   Diabetes  Mother    Osteoporosis Mother    Diabetes Father    Heart attack Father     Social History:  reports that she has quit smoking. She has never used smokeless tobacco. She reports that she does not drink alcohol  and does not use drugs.  ROS: All other review of systems were reviewed and are negative except what is noted above in HPI  Physical Exam: BP 115/73   Pulse 76   LMP 05/01/2021   Constitutional:  Alert and oriented, No acute distress. HEENT: [place] AT, moist mucus membranes.  Trachea midline, no masses. Cardiovascular: No clubbing, cyanosis, or edema. Respiratory: Normal respiratory effort, no increased work of breathing. GI: Abdomen is soft, nontender, nondistended, no abdominal masses GU: No CVA tenderness.  Lymph: No cervical or inguinal lymphadenopathy. Skin: No rashes, bruises or suspicious lesions. Neurologic: Grossly intact, no focal deficits, moving all 4 extremities. Psychiatric: Normal mood and affect.  Laboratory Data: Lab Results  Component Value Date   WBC 8.1 02/21/2024   HGB 14.0 02/21/2024   HCT 40.2 02/21/2024   MCV 82.2 02/21/2024   PLT 156 02/21/2024    Lab Results  Component Value Date   CREATININE 0.73 02/27/2024    No results found for: PSA  No results found for: TESTOSTERONE  Lab Results  Component Value Date   HGBA1C 7.5 (A) 07/09/2024    Urinalysis    Component Value Date/Time   COLORURINE YELLOW 02/21/2024 2127   APPEARANCEUR Clear 05/01/2024 1200   LABSPEC 1.014 02/21/2024 2127   PHURINE 6.0 02/21/2024 2127   GLUCOSEU Trace (A) 05/01/2024 1200   HGBUR LARGE (A) 02/21/2024 2127   BILIRUBINUR Negative 05/01/2024 1200   KETONESUR NEGATIVE 02/21/2024 2127   PROTEINUR Negative 05/01/2024 1200   PROTEINUR 30 (A) 02/21/2024 2127   UROBILINOGEN 0.2 12/21/2011 2226   NITRITE Negative 05/01/2024 1200   NITRITE NEGATIVE 02/21/2024 2127   LEUKOCYTESUR Negative 05/01/2024 1200   LEUKOCYTESUR NEGATIVE 02/21/2024 2127    Lab  Results  Component Value Date   LABMICR Comment 05/01/2024   WBCUA 6-10 (A) 04/02/2024   LABEPIT 0-10 04/02/2024   BACTERIA None seen 04/02/2024    Pertinent Imaging: KUb today: Images reviewed and discussed with the patient  Results for orders placed during the hospital encounter of 04/30/24  DG Abd 1 View  Narrative CLINICAL DATA:  Kidney stone.  Patient reports recent lithotripsy.  EXAM: ABDOMEN - 1 VIEW  COMPARISON:  Radiograph 04/14/2024.  CT 02/13/2024  FINDINGS: Previous calcification in the pelvis is not definitively seen on the current exam, although stool and bowel gas overlie the region of previous calcification. No stones project over the renal  beds. Moderate to large colonic stool burden without evidence of obstruction. There is also moderate ingested material in the stomach. Multiple scoliotic curvature of the spine.  IMPRESSION: 1. Previous calcification in the pelvis is not definitively seen on the current exam, although stool and bowel gas overlie the region of previous calcification. No stones project over the renal beds. 2. Moderate to large colonic stool burden without evidence of obstruction.   Electronically Signed By: Andrea Gasman M.D. On: 04/30/2024 17:57  No results found for this or any previous visit.  No results found for this or any previous visit.  No results found for this or any previous visit.  No results found for this or any previous visit.  No results found for this or any previous visit.  No results found for this or any previous visit.  Results for orders placed during the hospital encounter of 02/13/24  CT Renal Stone Study  Narrative CLINICAL DATA:  Right flank pain and pink tinged urine.  EXAM: CT ABDOMEN AND PELVIS WITHOUT CONTRAST  TECHNIQUE: Multidetector CT imaging of the abdomen and pelvis was performed following the standard protocol without IV contrast.  RADIATION DOSE REDUCTION: This exam was  performed according to the departmental dose-optimization program which includes automated exposure control, adjustment of the mA and/or kV according to patient size and/or use of iterative reconstruction technique.  COMPARISON:  CT without contrast 12/22/2011.  FINDINGS: Lower chest: No abnormality.  Hepatobiliary: Enlarged liver, measures 24 cm length with moderate to severe steatosis, similar enlargement on prior study.  Gallbladder is contracted and not well seen but some images suggest possibility of pericholecystic edema which may be seen with cholecystitis.  No calcified gallstones or biliary dilatation. Consider ultrasound for further evaluation.  Pancreas: Unremarkable without contrast.  Spleen: Mildly prominent measuring 13.5 cm AP. Several or size to the 2012 exam. No focal abnormality.  Adrenals/Urinary Tract: There is no adrenal mass. Again noted is a Bosniak 1 cyst in the posterior lower right kidney measuring 1.6 cm and 2.5 Hounsfield units. No follow-up imaging is needed. No interval change.  There is no other contour deforming abnormality of either unenhanced kidneys.  There are occasional punctate nonobstructive caliceal stones within both kidneys.  On the right there is moderate hydronephrosis due to a 5 x 6 x 4 mm UPJ stone.  The bilateral ureters are otherwise clear. The bladder is unremarkable for its degree of distention.  Stomach/Bowel: No dilatation or wall thickening including the retrocecal appendix.  Vascular/Lymphatic: There is a circumaortic left renal vein. No significant vascular findings are present. No enlarged abdominal or pelvic lymph nodes.  Reproductive: Uterus and bilateral adnexa are unremarkable.  Other: No abdominal wall hernia or abnormality. No abdominopelvic ascites.  Musculoskeletal: There are degenerative changes and mild levoscoliosis of the lumbar spine. Thoracic spondylosis. No acute or significant osseous  findings.  IMPRESSION: 1. Moderate right hydronephrosis due to a 5 x 6 x 4 mm UPJ stone. 2. Bilateral nonobstructive micronephrolithiasis. 3. Hepatomegaly with moderate to severe steatosis. Mild splenomegaly. 4. Contracted gallbladder but some images suggest possibility of pericholecystic edema which may be seen with cholecystitis. No calcified gallstones or biliary dilatation. Consider ultrasound for further evaluation.   Electronically Signed By: Francis Quam M.D. On: 02/13/2024 07:39   Assessment & Plan:    1. Kidney stones (Primary) Followup 6 months with KUB - Urinalysis, Routine w reflex microscopic   No follow-ups on file.  Belvie Clara, MD  [facility] [place]

## 2024-11-02 NOTE — Patient Instructions (Signed)

## 2024-11-03 LAB — URINALYSIS, ROUTINE W REFLEX MICROSCOPIC
Bilirubin, UA: NEGATIVE
Glucose, UA: NEGATIVE
Leukocytes,UA: NEGATIVE
Nitrite, UA: NEGATIVE
Protein,UA: NEGATIVE
RBC, UA: NEGATIVE
Specific Gravity, UA: 1.025 (ref 1.005–1.030)
Urobilinogen, Ur: 1 mg/dL (ref 0.2–1.0)
pH, UA: 6 (ref 5.0–7.5)

## 2024-11-12 ENCOUNTER — Ambulatory Visit (INDEPENDENT_AMBULATORY_CARE_PROVIDER_SITE_OTHER): Admitting: Nurse Practitioner

## 2024-11-12 ENCOUNTER — Other Ambulatory Visit (INDEPENDENT_AMBULATORY_CARE_PROVIDER_SITE_OTHER): Admitting: *Deleted

## 2024-11-12 ENCOUNTER — Encounter: Payer: Self-pay | Admitting: Nurse Practitioner

## 2024-11-12 VITALS — BP 110/68 | HR 95 | Ht 63.0 in | Wt 211.4 lb

## 2024-11-12 DIAGNOSIS — Z7984 Long term (current) use of oral hypoglycemic drugs: Secondary | ICD-10-CM | POA: Diagnosis not present

## 2024-11-12 DIAGNOSIS — I10 Essential (primary) hypertension: Secondary | ICD-10-CM

## 2024-11-12 DIAGNOSIS — E119 Type 2 diabetes mellitus without complications: Secondary | ICD-10-CM

## 2024-11-12 DIAGNOSIS — Z7985 Long-term (current) use of injectable non-insulin antidiabetic drugs: Secondary | ICD-10-CM

## 2024-11-12 DIAGNOSIS — E782 Mixed hyperlipidemia: Secondary | ICD-10-CM

## 2024-11-12 DIAGNOSIS — Z794 Long term (current) use of insulin: Secondary | ICD-10-CM | POA: Diagnosis not present

## 2024-11-12 LAB — POCT GLYCOSYLATED HEMOGLOBIN (HGB A1C): Hemoglobin A1C: 8 % — AB (ref 4.0–5.6)

## 2024-11-12 MED ORDER — METFORMIN HCL ER 500 MG PO TB24: 1000.0000 mg | ORAL_TABLET | Freq: Every day | ORAL | 3 refills | Status: AC

## 2024-11-12 MED ORDER — TIRZEPATIDE 12.5 MG/0.5ML ~~LOC~~ SOAJ: 12.5000 mg | SUBCUTANEOUS | 1 refills | Status: AC

## 2024-11-12 NOTE — Progress Notes (Signed)
 Endocrinology Follow Up Note       11/12/2024, 3:59 PM   Subjective:    Patient ID: Monique Foster, female    DOB: [birth year].  Monique Foster is being seen in follow up after being seen in consultation for management of currently uncontrolled symptomatic diabetes requested by  Atilano Deward ORN, MD.   Past Medical History:  Diagnosis Date   Asthma    Diabetes mellitus without complication (HCC)    GERD (gastroesophageal reflux disease)    Sleep apnea     Past Surgical History:  Procedure Laterality Date   CESAREAN SECTION     EXTRACORPOREAL SHOCK WAVE LITHOTRIPSY Right 04/14/2024   Procedure: LITHOTRIPSY, ESWL;  Surgeon: Sherrilee Belvie LITTIE, MD;  Location: [facility];  Service: Urology;  Laterality: Right;   TUBAL LIGATION      Social History   Socioeconomic History   Marital status: Married    Spouse name: Not on file   Number of children: Not on file   Years of education: Not on file   Highest education level: Not on file  Occupational History   Not on file  Tobacco Use   Smoking status: Former   Smokeless tobacco: Never  Vaping Use   Vaping status: Never Used  Substance and Sexual Activity   Alcohol  use: No   Drug use: No   Sexual activity: Not on file  Other Topics Concern   Not on file  Social History Narrative   Not on file   Social Drivers of Health   Financial Resource Strain: Not on file  Food Insecurity: Not on file  Transportation Needs: Not on file  Physical Activity: Not on file  Stress: Not on file  Social Connections: Not on file    Family History  Problem Relation Age of Onset   Diabetes Mother    Osteoporosis Mother    Diabetes Father    Heart attack Father     Outpatient Encounter Medications as of 11/12/2024  Medication Sig   albuterol  (VENTOLIN  HFA) 108 (90 Base) MCG/ACT inhaler Inhale 2 puffs into the lungs every 4 (four) hours as needed for wheezing or shortness  of breath.   atorvastatin (LIPITOR) 10 MG tablet Take 10 mg by mouth daily.   cetirizine (ZYRTEC) 10 MG tablet Take 10 mg by mouth 2 (two) times daily.   Coenzyme Q10 200 MG capsule Take 200 mg by mouth daily.   Continuous Glucose Sensor (FREESTYLE LIBRE 3 PLUS SENSOR) MISC Change sensor every 15 days.   esomeprazole (NEXIUM) 20 MG capsule Take 20 mg by mouth daily.    esomeprazole (NEXIUM) 20 MG capsule Take 20 mg by mouth daily at 12 noon.   gemfibrozil (LOPID) 600 MG tablet Take 600 mg by mouth 2 (two) times daily.   Insulin Pen Needle (PEN NEEDLES) 31G X 5 MM MISC Use to inject insulin once daily   losartan (COZAAR) 25 MG tablet Take 25 mg by mouth daily.   Misc Natural Products (GLUCOSAMINE CHOND MSM FORMULA PO) Take by mouth. Patient states that she takes 2 a day   Multiple Vitamin (MULTIVITAMIN) capsule Take 1 capsule by mouth daily.   ondansetron  (ZOFRAN ) 4  MG tablet Take 1 tablet (4 mg total) by mouth every 8 (eight) hours as needed for nausea or vomiting.   OVER THE COUNTER MEDICATION AMbren daily for hot flashes   oxyCODONE -acetaminophen  (PERCOCET/ROXICET) 5-325 MG tablet Take 1 tablet by mouth every 4 (four) hours as needed for severe pain (pain score 7-10).   RELION PEN NEEDLES 32G X 4 MM MISC    rosuvastatin (CRESTOR) 5 MG tablet Take 5 mg by mouth daily.   [DISCONTINUED] metFORMIN  (GLUCOPHAGE -XR) 500 MG 24 hr tablet Take 2 tablets (1,000 mg total) by mouth daily with breakfast.   [DISCONTINUED] tirzepatide  (MOUNJARO ) 12.5 MG/0.5ML Pen Inject 12.5 mg into the skin once a week.   cetirizine (ZYRTEC) 10 MG tablet Take 10 mg by mouth daily. (Patient not taking: Reported on 11/12/2024)   Cholecalciferol (VITAMIN D3) 50 MCG (2000 UT) CAPS Take by mouth.   metFORMIN  (GLUCOPHAGE -XR) 500 MG 24 hr tablet Take 2 tablets (1,000 mg total) by mouth daily with breakfast.   tamsulosin  (FLOMAX ) 0.4 MG CAPS capsule Take 1 capsule (0.4 mg total) by mouth daily. (Patient not taking: Reported on  11/12/2024)   tirzepatide  (MOUNJARO ) 12.5 MG/0.5ML Pen Inject 12.5 mg into the skin once a week.   No facility-administered encounter medications on file as of 11/12/2024.    ALLERGIES: Allergies  Allergen Reactions   Iodine Shortness Of Breath and Rash   Levofloxacin Swelling    Facial Area Facial Area   Shellfish Allergy Shortness Of Breath, Nausea And Vomiting and Rash   Shellfish Protein-Containing Drug Products Nausea And Vomiting, Rash and Shortness Of Breath   Latex Itching and Rash   Penicillins Rash    VACCINATION STATUS:  There is no immunization history on file for this patient.  Diabetes She presents for her follow-up diabetic visit. She has type 2 diabetes mellitus. Onset time: Diagnosed at approx age of 8. Her disease course has been stable. There are no hypoglycemic associated symptoms. Pertinent negatives for diabetes include no fatigue. There are no hypoglycemic complications. Symptoms are stable. There are no diabetic complications. Risk factors for coronary artery disease include diabetes mellitus, dyslipidemia, obesity and sedentary lifestyle. Current diabetic treatment includes oral agent (monotherapy) (and Mounjaro ). She is compliant with treatment most of the time. Her weight is decreasing steadily. She is following a generally unhealthy diet. When asked about meal planning, she reported none. She has not had a previous visit with a dietitian. She participates in exercise intermittently. Her overall blood glucose range is 140-180 mg/dl. (She presents today with her CGM showing slightly above target glycemic profile overall.  Her POCT A1c today is 8%, increasing from last visit of 7.5%.  Analysis of her CGM shows TIR 69%, TAR 31%, TBR 0%.   She denies any hypoglycemia.  She notes she was off her Mounjaro  for several weeks due to life situations but recently started back about 2 weeks.) An ACE inhibitor/angiotensin II receptor blocker is being taken. She does not see a  podiatrist.Eye exam is current.    Review of systems  Constitutional: + increasing body weight,  current Body mass index is 37.45 kg/m. , no fatigue, no subjective hyperthermia, no subjective hypothermia Eyes: no blurry vision, no xerophthalmia ENT: no sore throat, no nodules palpated in throat, no dysphagia/odynophagia, no hoarseness Cardiovascular: no chest pain, no shortness of breath, no palpitations, no leg swelling Respiratory: no cough, no shortness of breath Gastrointestinal: no nausea/vomiting/diarrhea Musculoskeletal: no muscle/joint aches Skin: no rashes, no hyperemia Neurological: no tremors, no numbness, no  tingling, no dizziness Psychiatric: no depression, no anxiety   Objective:     BP 110/68 (BP Location: Right Arm, Patient Position: Sitting, Cuff Size: Large)   Pulse 95   Ht 5' 3 (1.6 m)   Wt 211 lb 6.4 oz (95.9 kg)   LMP 05/01/2021   BMI 37.45 kg/m   Wt Readings from Last 3 Encounters:  11/12/24 211 lb 6.4 oz (95.9 kg)  07/09/24 204 lb 3.2 oz (92.6 kg)  04/09/24 206 lb (93.4 kg)     BP Readings from Last 3 Encounters:  11/12/24 110/68  11/02/24 115/73  07/09/24 102/70       Physical Exam- Limited  Constitutional:  Body mass index is 37.45 kg/m. , not in acute distress, normal state of mind Eyes:  EOMI, no exophthalmos Musculoskeletal: no gross deformities, strength intact in all four extremities, no gross restriction of joint movements Skin:  no rashes, no hyperemia Neurological: no tremor with outstretched hands    Diabetic Foot Exam - Simple   No data filed     CMP ( most recent) CMP     Component Value Date/Time   NA 143 02/27/2024 0828   K 4.3 02/27/2024 0828   CL 101 02/27/2024 0828   CO2 26 02/27/2024 0828   GLUCOSE 137 (H) 02/27/2024 0828   GLUCOSE 221 (H) 02/21/2024 1914   BUN 17 02/27/2024 0828   CREATININE 0.73 02/27/2024 0828   CALCIUM 10.1 02/27/2024 0828   PROT 7.0 02/27/2024 0828   ALBUMIN 4.5 02/27/2024 0828    AST 31 02/27/2024 0828   ALT 38 (H) 02/27/2024 0828   ALKPHOS 59 02/27/2024 0828   BILITOT 0.8 02/27/2024 0828   GFRNONAA >60 02/21/2024 1914   GFRAA >90 04/15/2012 0247     Diabetic Labs (most recent): Lab Results  Component Value Date   HGBA1C 7.5 (A) 07/09/2024   HGBA1C 7.6 (A) 07/09/2024   HGBA1C 7.6 (A) 03/05/2024   MICROALBUR 80 mg/L 11/05/2023   MICROALBUR 10 mg /L 10/31/2022   MICROALBUR 30 10/18/2021     Lipid Panel ( most recent) Lipid Panel     Component Value Date/Time   CHOL 219 (H) 02/27/2024 0828   TRIG 184 (H) 02/27/2024 0828   HDL 37 (L) 02/27/2024 0828   CHOLHDL 5.9 (H) 02/27/2024 0828   LDLCALC 148 (H) 02/27/2024 0828   LABVLDL 34 02/27/2024 0828      Lab Results  Component Value Date   TSH 1.210 02/27/2024   TSH 1.140 10/25/2022   TSH 1.330 10/16/2021   FREET4 1.35 02/27/2024   FREET4 1.09 10/25/2022   FREET4 1.17 10/16/2021           Assessment & Plan:   1) Type 2 diabetes mellitus without complication, without long-term current use of insulin (HCC)  She presents today with her CGM showing slightly above target glycemic profile overall.  Her POCT A1c today is 8%, increasing from last visit of 7.5%.  Analysis of her CGM shows TIR 69%, TAR 31%, TBR 0%.   She denies any hypoglycemia.  She notes she was off her Mounjaro  for several weeks due to life situations but recently started back about 2 weeks.  - Monique Foster has currently uncontrolled symptomatic type 2 DM since 48 years of age.  -Recent labs reviewed.    - I had a long discussion with her about the progressive nature of diabetes and the pathology behind its complications. -her diabetes is not currently complicated but she remains at  a high risk for more acute and chronic complications which include CAD, CVA, CKD, retinopathy, and neuropathy. These are all discussed in detail with her.  The following Lifestyle Medicine recommendations according to American College of Lifestyle  [facility]) were discussed and offered to patient and she agrees to start the journey:  A. Whole Foods, Plant-based plate comprising of fruits and vegetables, plant-based proteins, whole-grain carbohydrates was discussed in detail with the patient.   A list for source of those nutrients were also provided to the patient.  Patient will use only water or unsweetened tea for hydration. B.  The need to stay away from risky substances including alcohol , smoking; obtaining 7 to 9 hours of restorative sleep, at least 150 minutes of moderate intensity exercise weekly, the importance of healthy social connections,  and stress reduction techniques were discussed. C.  A full color page of  Calorie density of various food groups per pound showing examples of each food groups was provided to the patient.  - Nutritional counseling repeated/built upon at each appointment.  - The patient admits there is a room for improvement in their diet and drink choices. -  Suggestion is made for the patient to avoid simple carbohydrates from their diet including Cakes, Sweet Desserts / Pastries, Ice Cream, Soda (diet and regular), Sweet Tea, Candies, Chips, Cookies, Sweet Pastries, Store Bought Juices, Alcohol  in Excess of 1-2 drinks a day, Artificial Sweeteners, Coffee Creamer, and Sugar-free Products. This will help patient to have stable blood glucose profile and potentially avoid unintended weight gain.   - I encouraged the patient to switch to unprocessed or minimally processed complex starch and increased protein intake (animal or plant source), fruits, and vegetables.   - Patient is advised to stick to a routine mealtimes to eat 3 meals a day and avoid unnecessary snacks (to snack only to correct hypoglycemia).  - she will be scheduled with Santana Duke, RDN, CDE for diabetes education.  - I have approached her with the following individualized plan to manage  her diabetes and patient agrees:   -She is advised  to continue Mounjaro  12.5 mg SQ weekly and Metformin  1000 mg ER daily after breakfast.   -She is encouraged to continue monitoring blood glucose at least twice daily (using her CGM), before breakfast and before bed, and to call the clinic if she has readings less than 70 or greater than 300 for 3 tests in a row.    - Specific targets for  A1c;  LDL, HDL,  and Triglycerides were discussed with the patient.  2) Blood Pressure /Hypertension:  her blood pressure is controlled to target.  She is advised to continue Losartan 25 mg po daily.  3) Lipids/Hyperlipidemia:    Her most recent lipid panel from 02/27/24 shows elevated LDL of 148 and elevated but significantly improved triglycerides of 184.  She is advised to continue Lipitor 10 mg po daily at bedtime and Gemfibrozil 600 mg po twice daily.  Side effects and precautions discussed with her.  She is advised to avoid fried foods and butter.    4)  Weight/Diet:  her Body mass index is 37.45 kg/m.  -  clearly complicating her diabetes care.   she is a candidate for weight loss. I discussed with her the fact that loss of 5 - 10% of her  current body weight will have the most impact on her diabetes management.  Exercise, and detailed carbohydrates information provided  -  detailed on discharge instructions.  5)  Vitamin D  deficiency Her recent vitamin d  level was 26.3.  Will recheck prior to next visit.  6) Chronic Care/Health Maintenance: -she is on Statin medications and is encouraged to initiate and continue to follow up with Ophthalmology, Dentist, Podiatrist at least yearly or according to recommendations, and advised to stay away from smoking. I have recommended yearly flu vaccine and pneumonia vaccine at least every 5 years; moderate intensity exercise for up to 150 minutes weekly; and sleep for at least 7 hours a day.  - she is advised to maintain close follow up with Sasser, Deward ORN, MD for primary care needs, as well as her other providers for  optimal and coordinated care.     I spent  31  minutes in the care of the patient today including review of labs from CMP, Lipids, Thyroid  Function, Hematology (current and previous including abstractions from other facilities); face-to-face time discussing  her blood glucose readings/logs, discussing hypoglycemia and hyperglycemia episodes and symptoms, medications doses, her options of short and long term treatment based on the latest standards of care / guidelines;  discussion about incorporating lifestyle medicine;  and documenting the encounter. Risk reduction counseling performed per USPSTF guidelines to reduce obesity and cardiovascular risk factors.     Please refer to Patient Instructions for Blood Glucose Monitoring and Insulin/Medications Dosing Guide  in media tab for additional information. Please  also refer to  Patient Self Inventory in the Media  tab for reviewed elements of pertinent patient history.  Monique Foster participated in the discussions, expressed understanding, and voiced agreement with the above plans.  All questions were answered to her satisfaction. she is encouraged to contact clinic should she have any questions or concerns prior to her return visit.    Follow up plan: - Return in about 4 months (around 03/12/2025) for Diabetes F/U with A1c in office, No previsit labs.   Benton Rio, [facility] [address] [place], [place] [address] Phone: [phone] Fax: [phone]  11/12/2024, 3:59 PM

## 2025-03-18 ENCOUNTER — Ambulatory Visit: Admitting: Nurse Practitioner

## 2025-05-03 ENCOUNTER — Ambulatory Visit: Admitting: Urology
